# Patient Record
Sex: Male | Born: 1960 | Hispanic: Yes | Marital: Married | Smoking: Never smoker
Health system: Southern US, Community
[De-identification: ages and names within clinical notes are randomized; demographics above are authoritative.]

## PROBLEM LIST (undated history)

## (undated) DIAGNOSIS — I219 Acute myocardial infarction, unspecified: Secondary | ICD-10-CM

## (undated) DIAGNOSIS — I255 Ischemic cardiomyopathy: Secondary | ICD-10-CM

## (undated) DIAGNOSIS — I2510 Atherosclerotic heart disease of native coronary artery without angina pectoris: Secondary | ICD-10-CM

## (undated) DIAGNOSIS — E119 Type 2 diabetes mellitus without complications: Secondary | ICD-10-CM

## (undated) DIAGNOSIS — R42 Dizziness and giddiness: Secondary | ICD-10-CM

## (undated) DIAGNOSIS — I209 Angina pectoris, unspecified: Secondary | ICD-10-CM

## (undated) DIAGNOSIS — I10 Essential (primary) hypertension: Secondary | ICD-10-CM

## (undated) DIAGNOSIS — E785 Hyperlipidemia, unspecified: Secondary | ICD-10-CM

## (undated) HISTORY — PX: CORONARY ANGIOPLASTY: SHX604

## (undated) HISTORY — DX: Ischemic cardiomyopathy: I25.5

## (undated) HISTORY — PX: COLONOSCOPY: SHX174

## (undated) HISTORY — DX: Essential (primary) hypertension: I10

## (undated) HISTORY — PX: CORONARY ANGIOPLASTY WITH STENT PLACEMENT: SHX49

## (undated) HISTORY — DX: Atherosclerotic heart disease of native coronary artery without angina pectoris: I25.10

## (undated) HISTORY — PX: OTHER SURGICAL HISTORY: SHX169

---

## 2008-11-28 ENCOUNTER — Ambulatory Visit: Payer: Self-pay | Admitting: Gastroenterology

## 2011-01-13 ENCOUNTER — Ambulatory Visit: Payer: Self-pay | Admitting: Internal Medicine

## 2015-03-05 ENCOUNTER — Emergency Department: Payer: Commercial Managed Care - HMO

## 2015-03-05 ENCOUNTER — Encounter: Payer: Self-pay | Admitting: Emergency Medicine

## 2015-03-05 ENCOUNTER — Inpatient Hospital Stay
Admission: EM | Admit: 2015-03-05 | Discharge: 2015-03-08 | DRG: 247 | Disposition: A | Discharge status: Home / Self Care | Payer: Commercial Managed Care - HMO | Attending: Internal Medicine | Admitting: Internal Medicine

## 2015-03-05 DIAGNOSIS — R001 Bradycardia, unspecified: Secondary | ICD-10-CM | POA: Diagnosis present

## 2015-03-05 DIAGNOSIS — Z833 Family history of diabetes mellitus: Secondary | ICD-10-CM | POA: Diagnosis not present

## 2015-03-05 DIAGNOSIS — E1159 Type 2 diabetes mellitus with other circulatory complications: Secondary | ICD-10-CM | POA: Diagnosis present

## 2015-03-05 DIAGNOSIS — Z79899 Other long term (current) drug therapy: Secondary | ICD-10-CM | POA: Diagnosis not present

## 2015-03-05 DIAGNOSIS — I255 Ischemic cardiomyopathy: Secondary | ICD-10-CM | POA: Diagnosis present

## 2015-03-05 DIAGNOSIS — Z9111 Patient's noncompliance with dietary regimen: Secondary | ICD-10-CM | POA: Diagnosis present

## 2015-03-05 DIAGNOSIS — I10 Essential (primary) hypertension: Secondary | ICD-10-CM | POA: Diagnosis present

## 2015-03-05 DIAGNOSIS — I214 Non-ST elevation (NSTEMI) myocardial infarction: Secondary | ICD-10-CM | POA: Diagnosis present

## 2015-03-05 DIAGNOSIS — I2510 Atherosclerotic heart disease of native coronary artery without angina pectoris: Secondary | ICD-10-CM | POA: Diagnosis not present

## 2015-03-05 DIAGNOSIS — Z8249 Family history of ischemic heart disease and other diseases of the circulatory system: Secondary | ICD-10-CM

## 2015-03-05 DIAGNOSIS — Z87891 Personal history of nicotine dependence: Secondary | ICD-10-CM | POA: Diagnosis not present

## 2015-03-05 DIAGNOSIS — I25119 Atherosclerotic heart disease of native coronary artery with unspecified angina pectoris: Secondary | ICD-10-CM | POA: Diagnosis present

## 2015-03-05 DIAGNOSIS — I209 Angina pectoris, unspecified: Secondary | ICD-10-CM | POA: Diagnosis not present

## 2015-03-05 DIAGNOSIS — E1151 Type 2 diabetes mellitus with diabetic peripheral angiopathy without gangrene: Secondary | ICD-10-CM | POA: Diagnosis not present

## 2015-03-05 DIAGNOSIS — R079 Chest pain, unspecified: Secondary | ICD-10-CM

## 2015-03-05 DIAGNOSIS — E1165 Type 2 diabetes mellitus with hyperglycemia: Secondary | ICD-10-CM | POA: Diagnosis present

## 2015-03-05 DIAGNOSIS — E785 Hyperlipidemia, unspecified: Secondary | ICD-10-CM | POA: Diagnosis present

## 2015-03-05 HISTORY — DX: Hyperlipidemia, unspecified: E78.5

## 2015-03-05 HISTORY — DX: Type 2 diabetes mellitus without complications: E11.9

## 2015-03-05 LAB — CBC WITH DIFFERENTIAL/PLATELET
BASOS ABS: 0 10*3/uL (ref 0–0.1)
Basophils Relative: 0 %
EOS PCT: 3 %
Eosinophils Absolute: 0.3 10*3/uL (ref 0–0.7)
HEMATOCRIT: 43.4 % (ref 40.0–52.0)
HEMOGLOBIN: 14.6 g/dL (ref 13.0–18.0)
LYMPHS PCT: 52 %
Lymphs Abs: 4.5 10*3/uL — ABNORMAL HIGH (ref 1.0–3.6)
MCH: 29.2 pg (ref 26.0–34.0)
MCHC: 33.7 g/dL (ref 32.0–36.0)
MCV: 86.7 fL (ref 80.0–100.0)
MONOS PCT: 12 %
Monocytes Absolute: 1.1 10*3/uL — ABNORMAL HIGH (ref 0.2–1.0)
NEUTROS PCT: 33 %
Neutro Abs: 2.9 10*3/uL (ref 1.4–6.5)
Platelets: 200 10*3/uL (ref 150–440)
RBC: 5 MIL/uL (ref 4.40–5.90)
RDW: 14.3 % (ref 11.5–14.5)
WBC: 8.8 10*3/uL (ref 3.8–10.6)

## 2015-03-05 LAB — COMPREHENSIVE METABOLIC PANEL
ALBUMIN: 4.3 g/dL (ref 3.5–5.0)
ALK PHOS: 68 U/L (ref 38–126)
ALT: 26 U/L (ref 17–63)
AST: 28 U/L (ref 15–41)
Anion gap: 8 (ref 5–15)
BILIRUBIN TOTAL: 0.4 mg/dL (ref 0.3–1.2)
BUN: 19 mg/dL (ref 6–20)
CALCIUM: 9 mg/dL (ref 8.9–10.3)
CO2: 27 mmol/L (ref 22–32)
Chloride: 104 mmol/L (ref 101–111)
Creatinine, Ser: 0.93 mg/dL (ref 0.61–1.24)
GFR calc Af Amer: >60 mL/min (ref >60)
GFR calc non Af Amer: >60 mL/min (ref >60)
GLUCOSE: 141 mg/dL — AB (ref 65–99)
Potassium: 3.9 mmol/L (ref 3.5–5.1)
SODIUM: 139 mmol/L (ref 135–145)
TOTAL PROTEIN: 7.4 g/dL (ref 6.5–8.1)

## 2015-03-05 LAB — GLUCOSE, CAPILLARY: Glucose-Capillary: 245 mg/dL — ABNORMAL HIGH (ref 65–99)

## 2015-03-05 LAB — PROTIME-INR
INR: 0.96
PROTHROMBIN TIME: 13 s (ref 11.4–15.0)

## 2015-03-05 LAB — TROPONIN I: Troponin I: 0.05 ng/mL — ABNORMAL HIGH (ref <0.031)

## 2015-03-05 LAB — APTT: aPTT: 28 seconds (ref 24–36)

## 2015-03-05 LAB — LIPASE, BLOOD: Lipase: 27 U/L (ref 22–51)

## 2015-03-05 MED ORDER — ONDANSETRON HCL 4 MG/2ML IJ SOLN: 4.0000 mg | Freq: Four times a day (QID) | INTRAMUSCULAR | Status: DC | PRN

## 2015-03-05 MED ORDER — HEPARIN (PORCINE) IN NACL 100-0.45 UNIT/ML-% IJ SOLN
900.0000 [IU]/h | INTRAMUSCULAR | Status: DC
  Administered 2015-03-05: 900 [IU]/h via INTRAVENOUS
  Filled 2015-03-05 (×2): qty 250

## 2015-03-05 MED ORDER — INSULIN ASPART 100 UNIT/ML ~~LOC~~ SOLN
0.0000 [IU] | Freq: Every day | SUBCUTANEOUS | Status: DC
  Administered 2015-03-06: 2 [IU] via SUBCUTANEOUS
  Filled 2015-03-05: qty 2

## 2015-03-05 MED ORDER — HEPARIN SODIUM (PORCINE) 5000 UNIT/ML IJ SOLN
INTRAMUSCULAR | Status: AC
  Filled 2015-03-05: qty 1

## 2015-03-05 MED ORDER — MORPHINE SULFATE (PF) 2 MG/ML IV SOLN: 2.0000 mg | INTRAVENOUS | Status: DC | PRN

## 2015-03-05 MED ORDER — ONDANSETRON HCL 4 MG PO TABS: 4.0000 mg | ORAL_TABLET | Freq: Four times a day (QID) | ORAL | Status: DC | PRN

## 2015-03-05 MED ORDER — HEPARIN BOLUS VIA INFUSION
4000.0000 [IU] | Freq: Once | INTRAVENOUS | Status: AC
  Administered 2015-03-05: 4000 [IU] via INTRAVENOUS
  Filled 2015-03-05: qty 4000

## 2015-03-05 MED ORDER — ACETAMINOPHEN 650 MG RE SUPP: 650.0000 mg | Freq: Four times a day (QID) | RECTAL | Status: DC | PRN

## 2015-03-05 MED ORDER — NITROGLYCERIN 0.4 MG SL SUBL: 0.4000 mg | SUBLINGUAL_TABLET | SUBLINGUAL | Status: DC | PRN

## 2015-03-05 MED ORDER — SODIUM CHLORIDE 0.9 % IJ SOLN
3.0000 mL | Freq: Two times a day (BID) | INTRAMUSCULAR | Status: DC
  Administered 2015-03-05 – 2015-03-06 (×2): 3 mL via INTRAVENOUS

## 2015-03-05 MED ORDER — OXYCODONE HCL 5 MG PO TABS: 5.0000 mg | ORAL_TABLET | ORAL | Status: DC | PRN

## 2015-03-05 MED ORDER — ATORVASTATIN CALCIUM 20 MG PO TABS: 20.0000 mg | ORAL_TABLET | Freq: Every day | ORAL | Status: DC

## 2015-03-05 MED ORDER — ASPIRIN EC 325 MG PO TBEC: 325.0000 mg | DELAYED_RELEASE_TABLET | Freq: Every day | ORAL | Status: DC

## 2015-03-05 MED ORDER — LISINOPRIL 5 MG PO TABS: 5.0000 mg | ORAL_TABLET | Freq: Every day | ORAL | Status: DC

## 2015-03-05 MED ORDER — INSULIN ASPART 100 UNIT/ML ~~LOC~~ SOLN
0.0000 [IU] | Freq: Three times a day (TID) | SUBCUTANEOUS | Status: DC
  Administered 2015-03-07: 3 [IU] via SUBCUTANEOUS
  Administered 2015-03-08: 2 [IU] via SUBCUTANEOUS
  Filled 2015-03-05: qty 2
  Filled 2015-03-05: qty 3

## 2015-03-05 MED ORDER — ACETAMINOPHEN 325 MG PO TABS: 650.0000 mg | ORAL_TABLET | Freq: Four times a day (QID) | ORAL | Status: DC | PRN

## 2015-03-05 MED ORDER — NITROGLYCERIN 2 % TD OINT
1.0000 [in_us] | TOPICAL_OINTMENT | Freq: Four times a day (QID) | TRANSDERMAL | Status: DC
  Administered 2015-03-05 – 2015-03-06 (×3): 1 [in_us] via TOPICAL
  Filled 2015-03-05 (×3): qty 1

## 2015-03-05 MED ORDER — INFLUENZA VAC SPLIT QUAD 0.5 ML IM SUSY
0.5000 mL | PREFILLED_SYRINGE | INTRAMUSCULAR | Status: AC
  Administered 2015-03-08: 0.5 mL via INTRAMUSCULAR
  Filled 2015-03-05: qty 0.5

## 2015-03-05 NOTE — H&P (Signed)
[facility] - [place] at [facility]   PATIENT NAME: Jeff Howard    MR#:  [id number]  DATE OF BIRTH:  [birth year]   DATE OF ADMISSION:  03/05/2015  PRIMARY CARE PHYSICIAN: [name], MD   REQUESTING/REFERRING PHYSICIAN: Shaune Pollack  CHIEF COMPLAINT:   Chief Complaint  Patient presents with  . Chest Pain    HISTORY OF PRESENT ILLNESS:  Jeff Howard  is a 54 y.o. male with a known history of type 2 diabetes non-insulin-requiring, essential hypertension presenting with chest pain. He describes acute onset chest pain which occurred approximately 8 PM at rest, retrosternal in location, pressure in quality, 8/10 intensity and no worsening or relieving factors nonradiating had some associated shortness of breath. Currently chest pain-free emergency department course noted mildly elevated troponin 0.05 subsequent started on therapeutic heparin by ED staff  PAST MEDICAL HISTORY:   Past Medical History  Diagnosis Date  . Diabetes mellitus without complication   . Hypertension   . Hyperlipemia     PAST SURGICAL HISTORY:  History reviewed. No pertinent past surgical history.  SOCIAL HISTORY:   Social History  Substance Use Topics  . Smoking status: Never Smoker   . Smokeless tobacco: Not on file  . Alcohol Use: No    FAMILY HISTORY:   Family History  Problem Relation Age of Onset  . Diabetes Other     DRUG ALLERGIES:  No Known Allergies  REVIEW OF SYSTEMS:  REVIEW OF SYSTEMS:  CONSTITUTIONAL: Denies fevers, chills, fatigue, weakness.  EYES: Denies blurred vision, double vision, or eye pain.  EARS, NOSE, THROAT: Denies tinnitus, ear pain, hearing loss.  RESPIRATORY: denies cough, positive shortness of breath, denies wheezing  CARDIOVASCULAR: Positive chest pain, denies palpitations, edema.  GASTROINTESTINAL: Denies nausea, vomiting, diarrhea, abdominal pain.  GENITOURINARY: Denies dysuria, hematuria.  ENDOCRINE: Denies  nocturia or thyroid problems. HEMATOLOGIC AND LYMPHATIC: Denies easy bruising or bleeding.  SKIN: Denies rash or lesions.  MUSCULOSKELETAL: Denies pain in neck, back, shoulder, knees, hips, or further arthritic symptoms.  NEUROLOGIC: Denies paralysis, paresthesias.  PSYCHIATRIC: Denies anxiety or depressive symptoms. Otherwise full review of systems performed by me is negative.   MEDICATIONS AT HOME:   Prior to Admission medications   Medication Sig Start Date End Date Taking? Authorizing Provider  atorvastatin (LIPITOR) 20 MG tablet Take 20 mg by mouth daily at 6 PM.   Yes Historical Provider, MD  lisinopril (PRINIVIL,ZESTRIL) 5 MG tablet Take 5 mg by mouth daily.   Yes Historical Provider, MD  metFORMIN (GLUCOPHAGE-XR) 500 MG 24 hr tablet Take 2,000 mg by mouth daily after supper.   Yes Historical Provider, MD  pioglitazone (ACTOS) 45 MG tablet Take 45 mg by mouth daily.   Yes Historical Provider, MD  saxagliptin HCl (ONGLYZA) 5 MG TABS tablet Take 5 mg by mouth daily.   Yes Historical Provider, MD      VITAL SIGNS:  Blood pressure 169/97, pulse 65, resp. rate 22, height  (1.676 m), weight 167 lb (75.751 kg), SpO2 98 %.  PHYSICAL EXAMINATION:  VITAL SIGNS: Filed Vitals:   03/05/15 2158  BP: 169/97  Pulse: 65  Resp: 22   GENERAL:54 y.o.male currently in no acute distress.  HEAD: Normocephalic, atraumatic.  EYES: Pupils equal, round, reactive to light. Extraocular muscles intact. No scleral icterus.  MOUTH: Moist mucosal membrane. Dentition intact. No abscess noted.  EAR, NOSE, THROAT: Clear without exudates. No external lesions.  NECK: Supple. No thyromegaly. No nodules. No JVD.  PULMONARY:  Clear to ascultation, without wheeze rails or rhonci. No use of accessory muscles, Good respiratory effort. good air entry bilaterally CHEST: Nontender to palpation.  CARDIOVASCULAR: S1 and S2. Regular rate and rhythm. No murmurs, rubs, or gallops. No edema. Pedal pulses 2+  bilaterally.  GASTROINTESTINAL: Soft, nontender, nondistended. No masses. Positive bowel sounds. No hepatosplenomegaly.  MUSCULOSKELETAL: No swelling, clubbing, or edema. Range of motion full in all extremities.  NEUROLOGIC: Cranial nerves II through XII are intact. No gross focal neurological deficits. Sensation intact. Reflexes intact.  SKIN: No ulceration, lesions, rashes, or cyanosis. Skin warm and dry. Turgor intact.  PSYCHIATRIC: Mood, affect within normal limits. The patient is awake, alert and oriented x 3. Insight, judgment intact.    LABORATORY PANEL:   CBC  Recent Labs Lab 03/05/15 2026  WBC 8.8  HGB 14.6  HCT 43.4  PLT 200   ------------------------------------------------------------------------------------------------------------------  Chemistries   Recent Labs Lab 03/05/15 2026  NA 139  K 3.9  CL 104  CO2 27  GLUCOSE 141*  BUN 19  CREATININE 0.93  CALCIUM 9.0  AST 28  ALT 26  ALKPHOS 68  BILITOT 0.4   ------------------------------------------------------------------------------------------------------------------  Cardiac Enzymes  Recent Labs Lab 03/05/15 2026  TROPONINI 0.05*   ------------------------------------------------------------------------------------------------------------------  RADIOLOGY:  Dg Chest Port 1 View  03/05/2015   CLINICAL DATA:  Chest pain beginning today. Diaphoresis and shortness of breath.  EXAM: PORTABLE CHEST 1 VIEW  COMPARISON:  None.  FINDINGS: The lungs are clear. Heart size normal. No pneumothorax or pleural effusion. No focal bony abnormality.  IMPRESSION: Negative chest.   Electronically Signed   By: Drusilla Kanner M.D.   On: 03/05/2015 20:54    EKG:   Orders placed or performed during the hospital encounter of 03/05/15  . EKG 12-Lead  . EKG 12-Lead  . ED EKG within 10 minutes  . ED EKG within 10 minutes    IMPRESSION AND PLAN:   54 year old Hispanic gentleman history of type 2 diabetes  non-insulin-requiring percent with chest pain.  1. NSTEMI: Initiate aspirin/statin therapy, place on telemetry, trend cardiac enzymes 3, therapeutic heparin are initiated, consult cardiology, check echocardiogram and lipid panel 2. Type 2 diabetes non-insulin-requiring: Hold oral agents at insulin sliding scale with Accu-Cheks 3. Essential hypertension: Lisinopril 4. Hyperlipidemia unspecified Lipitor 5. Venous thrombosis) prophylactic: Therapeutic heparin    All the records are reviewed and case discussed with ED provider. Management plans discussed with the patient, family and they are in agreement.  CODE STATUS: Full  TOTAL TIME TAKING CARE OF THIS PATIENT: 35 minutes.    Hower,  Mardi Mainland.D on 03/05/2015 at 10:28 PM  Between 7am to 6pm - Pager - [phone]  After 6pm: House Pager: - [phone]  [facility]  Office  [phone]  CC: Primary care physician; Shapely-Quinn, Desiree Lucy, MD

## 2015-03-05 NOTE — ED Notes (Signed)
Lab called for add on 

## 2015-03-05 NOTE — ED Notes (Signed)
reonset chest pain, EKG repeated, Dr Shaune Pollack notified

## 2015-03-05 NOTE — ED Notes (Signed)
Patient presents to Emergency Department via EMS with complaints of central chest pain.  EMS gave 324 ASA, and 3 x SL nitro, pt 10/10 pain at the time sweating and SOB.

## 2015-03-05 NOTE — Progress Notes (Signed)
ANTICOAGULATION CONSULT NOTE - Initial Consult  Pharmacy Consult for Heparin Indication: chest pain/ACS  No Known Allergies  Patient Measurements: Height:  (167.6 cm) Weight: 167 lb (75.751 kg) IBW/kg (Calculated) : 63.8 Heparin Dosing Weight: 75.8 kg  Vital Signs: BP: 166/102 mmHg (09/25 2025) Pulse Rate: 56 (09/25 2025)  Labs:  Recent Labs  03/05/15 2026  HGB 14.6  HCT 43.4  PLT 200  CREATININE 0.93  TROPONINI 0.05*    Estimated Creatinine Clearance: 80 mL/min (by C-G formula based on Cr of 0.93).   Medical History: Past Medical History  Diagnosis Date  . Diabetes mellitus without complication   . Hypertension   . Hyperlipemia     Medications:   (Not in a hospital admission)  Assessment: Pharmacy consulted to dose heparin in this 54 year old male admitted with ACS/STEMI. CrCl = ?  Goal of Therapy:  Heparin level 0.3-0.7 units/ml Monitor platelets by anticoagulation protocol: Yes   Plan:  Give 4000 units bolus x 1 Start heparin infusion at 900 units/hr  Will order baseline aPTT and INR. Will draw 1st HL 6 hrs after start of drip on 9/26 @   Robbins,Jason D 03/05/2015,9:30 PM

## 2015-03-05 NOTE — ED Notes (Signed)
Dr Shaune Pollack notified of TROPONIN 0.05

## 2015-03-05 NOTE — ED Provider Notes (Signed)
[facility] Emergency Department Provider Note   ____________________________________________  Time seen: 8:22 PM I have reviewed the triage vital signs and the triage nursing note.  HISTORY  Chief Complaint Chest Pain   Historian Patient  HPI Jeff Howard is a 54 y.o. male who had acute onset of central chest pain 10 out of 10 just prior to arrival. Patient called EMS. Patient was diaphoretic with mild soreness of breath, but no palpitations, no extension of the pain up into the jaw or to the shoulders or the back. He did have an episode like this yesterday in the morning that lasted for about 5 minutes and went away with rest. He does have a history of smoking, or prior coronary artery disease. He is treated for high blood pressure and diabetes reportedly. Patient received aspirin and sublingual nitroglycerin prior to arrival and chest pain now as 5 out of 10.    Past Medical History  Diagnosis Date  . Diabetes mellitus without complication   . Hypertension   . Hyperlipemia    diabetes and hypertension  There are no active problems to display for this patient.   History reviewed. No pertinent past surgical history.  No current outpatient prescriptions on file.  Allergies Review of patient's allergies indicates no known allergies.  History reviewed. No pertinent family history.  Social History Social History  Substance Use Topics  . Smoking status: Never Smoker   . Smokeless tobacco: None  . Alcohol Use: No    Review of Systems  Constitutional: Negative for fever. Eyes: Negative for visual changes. ENT: Negative for sore throat. Cardiovascular: Negative for palpitations. Respiratory: Negative for cough. Gastrointestinal: Negative for abdominal pain, vomiting and diarrhea. Genitourinary: Negative for dysuria. Musculoskeletal: Negative for back pain. Skin: Negative for rash. Neurological: Negative for headache. 10 point Review of  Systems otherwise negative ____________________________________________   PHYSICAL EXAM:  VITAL SIGNS: ED Triage Vitals  Enc Vitals Group     BP --      Pulse --      Resp --      Temp --      Temp src --      SpO2 --      Weight --      Height --      Head Cir --      Peak Flow --      Pain Score --      Pain Loc --      Pain Edu? --      Excl. in GC? --      Constitutional: Alert and oriented. Well appearing and in no distress. Slightly diaphoretic. Eyes: Conjunctivae are normal. PERRL. Normal extraocular movements. ENT   Head: Normocephalic and atraumatic.   Nose: No congestion/rhinnorhea.   Mouth/Throat: Mucous membranes are moist.   Neck: No stridor. Cardiovascular/Chest: Normal rate, regular rhythm.  No murmurs, rubs, or gallops. Respiratory: Normal respiratory effort without tachypnea nor retractions. Breath sounds are clear and equal bilaterally. No wheezes/rales/rhonchi. Gastrointestinal: Soft. No distention, no guarding, no rebound. Nontender   Genitourinary/rectal:Deferred Musculoskeletal: Nontender with normal range of motion in all extremities. No joint effusions.  No lower extremity tenderness.  No edema. Neurologic:  Normal speech and language. No gross or focal neurologic deficits are appreciated. Skin:  Skin is warm, dry and intact. No rash noted. Psychiatric: Mood and affect are normal. Speech and behavior are normal. Patient exhibits appropriate insight and judgment.  ____________________________________________   EKG I, Governor Rooks, MD, the attending  physician have personally viewed and interpreted all ECGs.  20:27 sinus bradycardia 57 bpm. Narrow QRS. Normal axis. Minimal ST depression in lead 3 which is isolated. Otherwise no ST segment elevation or ST segment depression or T-wave inversion.  21:18 sinus bradycardia 58 bpm. Narrow QRS. Normal axis. No ST segment elevation, ST segment depression, or T-wave  inversion. ____________________________________________  LABS (pertinent positives/negatives)  Troponin 0.05 White blood count 8.8, hemoglobin 14.6 and platelet count 200 Comprehensive metabolic panel without significant abnormality Lipase 27  ____________________________________________  RADIOLOGY All Xrays were viewed by me. Imaging interpreted by Radiologist.  Chest x-ray portable one view: Negative chest __________________________________________  PROCEDURES  Procedure(s) performed: None  Critical Care performed: CRITICAL CARE Performed by: Governor Rooks   Total critical care time: 30 minutes  Critical care time was exclusive of separately billable procedures and treating other patients.  Critical care was necessary to treat or prevent imminent or life-threatening deterioration.  Critical care was time spent personally by me on the following activities: development of treatment plan with patient and/or surrogate as well as nursing, discussions with consultants, evaluation of patient's response to treatment, examination of patient, obtaining history from patient or surrogate, ordering and performing treatments and interventions, ordering and review of laboratory studies, ordering and review of radiographic studies, pulse oximetry and re-evaluation of patient's condition.   ____________________________________________   ED COURSE / ASSESSMENT AND PLAN  CONSULTATIONS: Face-to-face consultation with hospitalist for admission. Further consultation with cardiologist, Dr. Gwen Pounds  Pertinent labs & imaging results that were available during my care of the patient were reviewed by me and considered in my medical decision making (see chart for details).   Patient arrived with a complaint of initially 10 out of 10 chest pain with EMS, but on arrival after aspirin and supplemental nitroglycerin patient was now 5 out of 10. His EKGs prehospital and obtained here in the ED show  no concerning findings for acute cord syndrome, however his symptoms do sound potentially cardiac in etiology given the central chest pain with diaphoresis.  I was notified by the nurse and the patient began having chest pain again, and repeat EKG was performed. No new changes without EKG. At about the same time troponin was called minimally elevated 0.05. Given the patient's clinical presentation, I will go ahead and treat him with heparin bolus and drip for acute coronary syndrome and admit him to the hospital for further evaluation and management.  I was notified by the nurse and the patient had a change in his heart rhythm that was captured on the monitor. I reviewed this monitor strip initially showed a wide-complex what appeared to be left bundle branch block that extended for several screened on the monitor, and then 20 a converted back to a narrow complex normal sinus rhythm. I discussed this case including the rhythm change with Dr. Gwen Pounds, who did not return from and any additional emergency department treatment, but continue on aspirin, heparin, nitrates, and hospital admission.  Patient / Family / Caregiver informed of clinical course, medical decision-making process, and agree with plan.   ___________________________________________   FINAL CLINICAL IMPRESSION(S) / ED DIAGNOSES   Final diagnoses:  Chest pain, unspecified chest pain type       Governor Rooks, MD 03/05/15 2228

## 2015-03-06 ENCOUNTER — Encounter
Admission: EM | Disposition: A | Discharge status: Home / Self Care | Payer: Self-pay | Source: Home / Self Care | Attending: Internal Medicine

## 2015-03-06 ENCOUNTER — Inpatient Hospital Stay (HOSPITAL_COMMUNITY)
Admit: 2015-03-06 | Discharge: 2015-03-06 | Disposition: A | Discharge status: Home / Self Care | Payer: Commercial Managed Care - HMO | Attending: Internal Medicine | Admitting: Internal Medicine

## 2015-03-06 DIAGNOSIS — I214 Non-ST elevation (NSTEMI) myocardial infarction: Principal | ICD-10-CM

## 2015-03-06 DIAGNOSIS — I2510 Atherosclerotic heart disease of native coronary artery without angina pectoris: Secondary | ICD-10-CM

## 2015-03-06 DIAGNOSIS — R079 Chest pain, unspecified: Secondary | ICD-10-CM

## 2015-03-06 HISTORY — PX: CARDIAC CATHETERIZATION: SHX172

## 2015-03-06 LAB — CBC
HEMATOCRIT: 41.5 % (ref 40.0–52.0)
Hemoglobin: 14 g/dL (ref 13.0–18.0)
MCH: 28.9 pg (ref 26.0–34.0)
MCHC: 33.8 g/dL (ref 32.0–36.0)
MCV: 85.5 fL (ref 80.0–100.0)
PLATELETS: 182 10*3/uL (ref 150–440)
RBC: 4.85 MIL/uL (ref 4.40–5.90)
RDW: 14.4 % (ref 11.5–14.5)
WBC: 9.4 10*3/uL (ref 3.8–10.6)

## 2015-03-06 LAB — BASIC METABOLIC PANEL
Anion gap: 6 (ref 5–15)
BUN: 18 mg/dL (ref 6–20)
CHLORIDE: 107 mmol/L (ref 101–111)
CO2: 26 mmol/L (ref 22–32)
CREATININE: 0.76 mg/dL (ref 0.61–1.24)
Calcium: 8.5 mg/dL — ABNORMAL LOW (ref 8.9–10.3)
GFR calc Af Amer: >60 mL/min (ref >60)
GFR calc non Af Amer: >60 mL/min (ref >60)
Glucose, Bld: 134 mg/dL — ABNORMAL HIGH (ref 65–99)
POTASSIUM: 4 mmol/L (ref 3.5–5.1)
Sodium: 139 mmol/L (ref 135–145)

## 2015-03-06 LAB — LIPID PANEL
CHOL/HDL RATIO: 2.7 ratio
CHOLESTEROL: 140 mg/dL (ref 0–200)
HDL: 52 mg/dL (ref >40)
LDL CALC: 76 mg/dL (ref 0–99)
TRIGLYCERIDES: 60 mg/dL (ref <150)
VLDL: 12 mg/dL (ref 0–40)

## 2015-03-06 LAB — TROPONIN I
Troponin I: 7.94 ng/mL — ABNORMAL HIGH (ref <0.031)
Troponin I: 28.04 ng/mL — ABNORMAL HIGH (ref <0.031)

## 2015-03-06 LAB — GLUCOSE, CAPILLARY
Glucose-Capillary: 129 mg/dL — ABNORMAL HIGH (ref 65–99)
Glucose-Capillary: 165 mg/dL — ABNORMAL HIGH (ref 65–99)
Glucose-Capillary: 153 mg/dL — ABNORMAL HIGH (ref 65–99)

## 2015-03-06 LAB — HEPARIN LEVEL (UNFRACTIONATED): HEPARIN UNFRACTIONATED: 0.31 [IU]/mL (ref 0.30–0.70)

## 2015-03-06 SURGERY — LEFT HEART CATH AND CORONARY ANGIOGRAPHY
Anesthesia: Moderate Sedation

## 2015-03-06 MED ORDER — SODIUM CHLORIDE 0.9 % IJ SOLN: 3.0000 mL | INTRAMUSCULAR | Status: DC | PRN

## 2015-03-06 MED ORDER — ASPIRIN 81 MG PO CHEW
81.0000 mg | CHEWABLE_TABLET | Freq: Every day | ORAL | Status: DC
  Administered 2015-03-07 – 2015-03-08 (×2): 81 mg via ORAL
  Filled 2015-03-06 (×2): qty 1

## 2015-03-06 MED ORDER — SODIUM CHLORIDE 0.9 % IV SOLN
INTRAVENOUS | Status: AC
  Administered 2015-03-06: 13:00:00 via INTRAVENOUS

## 2015-03-06 MED ORDER — SODIUM CHLORIDE 0.9 % IV SOLN: 250.0000 mL | INTRAVENOUS | Status: DC | PRN

## 2015-03-06 MED ORDER — VERAPAMIL HCL 2.5 MG/ML IV SOLN
INTRAVENOUS | Status: DC | PRN
  Administered 2015-03-06: 2.5 mg via INTRA_ARTERIAL

## 2015-03-06 MED ORDER — FENTANYL CITRATE (PF) 100 MCG/2ML IJ SOLN
INTRAMUSCULAR | Status: DC | PRN
  Administered 2015-03-06: 50 ug via INTRAVENOUS
  Administered 2015-03-06: 25 ug via INTRAVENOUS

## 2015-03-06 MED ORDER — HEPARIN SODIUM (PORCINE) 1000 UNIT/ML IJ SOLN
INTRAMUSCULAR | Status: DC | PRN
  Administered 2015-03-06: 4000 [IU] via INTRAVENOUS

## 2015-03-06 MED ORDER — VERAPAMIL HCL 2.5 MG/ML IV SOLN
INTRAVENOUS | Status: AC
  Filled 2015-03-06: qty 2

## 2015-03-06 MED ORDER — ASPIRIN 81 MG PO CHEW
CHEWABLE_TABLET | ORAL | Status: AC
  Filled 2015-03-06: qty 4

## 2015-03-06 MED ORDER — HEPARIN (PORCINE) IN NACL 2-0.9 UNIT/ML-% IJ SOLN
INTRAMUSCULAR | Status: AC
  Filled 2015-03-06: qty 1000

## 2015-03-06 MED ORDER — PRASUGREL HCL 10 MG PO TABS
ORAL_TABLET | ORAL | Status: AC
  Filled 2015-03-06: qty 6

## 2015-03-06 MED ORDER — ATORVASTATIN CALCIUM 20 MG PO TABS
80.0000 mg | ORAL_TABLET | Freq: Every day | ORAL | Status: DC
  Administered 2015-03-06 – 2015-03-07 (×2): 80 mg via ORAL
  Filled 2015-03-06 (×3): qty 4
  Filled 2015-03-06: qty 1

## 2015-03-06 MED ORDER — ASPIRIN 81 MG PO CHEW
CHEWABLE_TABLET | ORAL | Status: DC | PRN
  Administered 2015-03-06: 324 mg via ORAL

## 2015-03-06 MED ORDER — NITROGLYCERIN 1 MG/10 ML FOR IR/CATH LAB
INTRA_ARTERIAL | Status: DC | PRN
  Administered 2015-03-06 (×2): 200 ug via INTRACORONARY

## 2015-03-06 MED ORDER — SODIUM CHLORIDE 0.9 % IJ SOLN: 3.0000 mL | Freq: Two times a day (BID) | INTRAMUSCULAR | Status: DC

## 2015-03-06 MED ORDER — PRASUGREL HCL 10 MG PO TABS
10.0000 mg | ORAL_TABLET | Freq: Every day | ORAL | Status: DC
  Administered 2015-03-07 – 2015-03-08 (×2): 10 mg via ORAL
  Filled 2015-03-06 (×2): qty 1

## 2015-03-06 MED ORDER — SODIUM CHLORIDE 0.9 % IV SOLN: INTRAVENOUS | Status: DC

## 2015-03-06 MED ORDER — SODIUM CHLORIDE 0.9 % IJ SOLN
3.0000 mL | Freq: Two times a day (BID) | INTRAMUSCULAR | Status: DC
  Administered 2015-03-06 – 2015-03-08 (×4): 3 mL via INTRAVENOUS

## 2015-03-06 MED ORDER — HEPARIN SODIUM (PORCINE) 1000 UNIT/ML IJ SOLN
INTRAMUSCULAR | Status: AC
  Filled 2015-03-06: qty 1

## 2015-03-06 MED ORDER — NITROGLYCERIN 5 MG/ML IV SOLN
INTRAVENOUS | Status: AC
  Filled 2015-03-06: qty 10

## 2015-03-06 MED ORDER — BIVALIRUDIN BOLUS VIA INFUSION - CUPID
INTRAVENOUS | Status: DC | PRN
  Administered 2015-03-06: 58.2 mg via INTRAVENOUS

## 2015-03-06 MED ORDER — ASPIRIN 81 MG PO CHEW: 81.0000 mg | CHEWABLE_TABLET | ORAL | Status: DC

## 2015-03-06 MED ORDER — PRASUGREL HCL 10 MG PO TABS
ORAL_TABLET | ORAL | Status: DC | PRN
  Administered 2015-03-06: 60 mg via ORAL

## 2015-03-06 MED ORDER — MIDAZOLAM HCL 2 MG/2ML IJ SOLN
INTRAMUSCULAR | Status: AC
  Filled 2015-03-06: qty 2

## 2015-03-06 MED ORDER — BIVALIRUDIN 250 MG IV SOLR
INTRAVENOUS | Status: AC
  Filled 2015-03-06: qty 250

## 2015-03-06 MED ORDER — IOHEXOL 300 MG/ML  SOLN
INTRAMUSCULAR | Status: DC | PRN
  Administered 2015-03-06: 210 mL

## 2015-03-06 MED ORDER — BIVALIRUDIN 250 MG IV SOLR: 250.0000 mg | INTRAVENOUS | Status: DC | PRN

## 2015-03-06 MED ORDER — MIDAZOLAM HCL 2 MG/2ML IJ SOLN
INTRAMUSCULAR | Status: DC | PRN
  Administered 2015-03-06: 1 mg via INTRAVENOUS

## 2015-03-06 MED ORDER — LISINOPRIL 10 MG PO TABS
10.0000 mg | ORAL_TABLET | Freq: Every day | ORAL | Status: DC
  Administered 2015-03-06 – 2015-03-08 (×3): 10 mg via ORAL
  Filled 2015-03-06 (×3): qty 1

## 2015-03-06 MED ORDER — FENTANYL CITRATE (PF) 100 MCG/2ML IJ SOLN
INTRAMUSCULAR | Status: AC
  Filled 2015-03-06: qty 2

## 2015-03-06 SURGICAL SUPPLY — 20 items
BALLN TREK RX 2.5X12 (BALLOONS) ×2
BALLN ~~LOC~~ TREK RX 3.0X15 (BALLOONS) ×2
BALLOON TREK RX 2.5X12 (BALLOONS) ×1 IMPLANT
BALLOON ~~LOC~~ TREK RX 3.0X15 (BALLOONS) ×1 IMPLANT
CATH INFINITI 5FR ANG PIGTAIL (CATHETERS) IMPLANT
CATH INFINITI 5FR JL4 (CATHETERS) IMPLANT
CATH INFINITI JR4 5F (CATHETERS) IMPLANT
CATH OPTITORQUE JACKY 4.0 5F (CATHETERS) ×2 IMPLANT
CATH VISTA GUIDE 6FR XBLAD3.5 (CATHETERS) ×2 IMPLANT
DEVICE INFLAT 30 PLUS (MISCELLANEOUS) ×2 IMPLANT
DEVICE RAD TR BAND REGULAR (VASCULAR PRODUCTS) ×2 IMPLANT
GLIDESHEATH SLEND SS 6F .021 (SHEATH) ×2 IMPLANT
KIT MANI 3VAL PERCEP (MISCELLANEOUS) ×2 IMPLANT
NEEDLE PERC 18GX7CM (NEEDLE) IMPLANT
PACK CARDIAC CATH (CUSTOM PROCEDURE TRAY) ×2 IMPLANT
SHEATH AVANTI 5FR X 11CM (SHEATH) IMPLANT
STENT XIENCE ALPINE RX 2.5X8 (Permanent Stent) ×2 IMPLANT
STENT XIENCE ALPINE RX 2.75X23 (Permanent Stent) ×2 IMPLANT
WIRE INTUITION PROPEL ST 180CM (WIRE) ×2 IMPLANT
WIRE SAFE-T 1.5MM-J .035X260CM (WIRE) ×2 IMPLANT

## 2015-03-06 NOTE — CV Procedure (Signed)
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 S/p PCI and 2 DES placement to LAD.  Full procedure note to follow.

## 2015-03-06 NOTE — Care Management Note (Signed)
Case Management Note  Patient Details  Name: Jeff Howard MRN: [id number] Date of Birth: 03-29-1961  Subjective/Objective:       Cardiac cath today - out of room.             Action/Plan:   Expected Discharge Date:                  Expected Discharge Plan:     In-House Referral:     Discharge planning Services     Post Acute Care Choice:    Choice offered to:     DME Arranged:    DME Agency:     HH Arranged:    HH Agency:     Status of Service:     Medicare Important Message Given:    Date Medicare IM Given:    Medicare IM give by:    Date Additional Medicare IM Given:    Additional Medicare Important Message give by:     If discussed at Long Length of Stay Meetings, dates discussed:    Additional Comments:  Rockett,Marilyn A, RN 03/06/2015, 9:29 AM

## 2015-03-06 NOTE — Consult Note (Signed)
Cardiology Consultation Note  Patient ID: Jeff Howard, MRN: [id number], DOB/AGE: 1961/02/10 54 y.o. Admit date: 03/05/2015   Date of Consult: 03/06/2015 Primary Physician: [name], MD Primary Cardiologist: New to [facility]  Chief Complaint: Chest pain Reason for Consult: NSTEMI  HPI: 54 y.o. male with h/o DM2, HLD, and HTN who presented to [facility] on 9/25 with onset of chest pain initially on 9/24 x 5-10 minutes and again on 9/25 lasting 1 hour and was found to have a NSTEMI with troponin of 28 at time of consult.   He denies any previously known cardiac history. His wife reports him having had a stress test approximately 3 years prior for an isolated episode of left sided paresthesias. We do not have these results at this time. He works as a Art gallery manager and has been asymptomatic up until this past weekend. He denies any history of tobacco, abuse or illegal drugs. He rarely drinks alcohol on the weekends at a rate of 2 beers with friends. His father had an MI in his late 20's followed by bypass surgery. Per the family the father ultimately had to undergo repeat bypass surgery later in life as well. The father also suffer stroke x 2. Patient's mother has history of HLD, along with his sisters.   He developed pressure-like chest pain lasting 5-10 minutes while at a store walking around on 9/24. There was some associated diaphoresis, otherwise no associated symptoms. He did not think much of this episode and continued on with his daily activities. This was the first time he had ever developed chest pain. While sitting at home watching TV on 9/25 he developed pressure-like chest pain that persisted until he arrived to [facility] and received "medicine." Pain did not radiate. There again was associated diaphoresis, otherwise no associated symptoms. Weight has been declining in the setting of traveler's diarrhea this summer, though this has improved.   Upon his arrival to [facility] he was  found to have a troponin of 0.05-->7.94-->28.04. ECG showed sinus bradycardia, 57 bpm, 1 mm st/t anterior changes with reciprocal inferior st/t changes. CXR negative. He was started on a heparin gtt. He required nitro paste to remain chest pain free. He had one episode of chest pain overnight. He is currently chest pain free.          Past Medical History  Diagnosis Date  . Diabetes mellitus without complication   . Hypertension   . Hyperlipemia       Most Recent Cardiac Studies: None   Surgical History: History reviewed. No pertinent past surgical history.   Home Meds: Prior to Admission medications   Medication Sig Start Date End Date Taking? Authorizing Provider  atorvastatin (LIPITOR) 20 MG tablet Take 20 mg by mouth daily at 6 PM.   Yes Historical Provider, MD  lisinopril (PRINIVIL,ZESTRIL) 5 MG tablet Take 5 mg by mouth daily.   Yes Historical Provider, MD  metFORMIN (GLUCOPHAGE-XR) 500 MG 24 hr tablet Take 2,000 mg by mouth daily after supper.   Yes Historical Provider, MD  pioglitazone (ACTOS) 45 MG tablet Take 45 mg by mouth daily.   Yes Historical Provider, MD  saxagliptin HCl (ONGLYZA) 5 MG TABS tablet Take 5 mg by mouth daily.   Yes Historical Provider, MD    Inpatient Medications:  . aspirin EC  325 mg Oral Daily  . atorvastatin  20 mg Oral q1800  . heparin      . Influenza vac split quadrivalent PF  0.5 mL  Intramuscular Tomorrow-1000  . insulin aspart  0-15 Units Subcutaneous TID WC  . insulin aspart  0-5 Units Subcutaneous QHS  . lisinopril  5 mg Oral Daily  . nitroGLYCERIN  1 inch Topical 4 times per day  . sodium chloride  3 mL Intravenous Q12H   . heparin 900 Units/hr (03/05/15 2142)    Allergies: No Known Allergies  Social History   Social History  . Marital Status: Married    Spouse Name: N/A  . Number of Children: N/A  . Years of Education: N/A   Occupational History  . Not on file.   Social History Main Topics  . Smoking status: Never Smoker    . Smokeless tobacco: Not on file  . Alcohol Use: No  . Drug Use: No  . Sexual Activity: Not on file   Other Topics Concern  . Not on file   Social History Narrative  . No narrative on file     Family History  Problem Relation Age of Onset  . Diabetes Other      Review of Systems: Review of Systems  Constitutional: Positive for weight loss, malaise/fatigue and diaphoresis. Negative for fever and chills.  HENT: Negative for congestion.   Eyes: Negative for discharge and redness.  Respiratory: Negative for cough, hemoptysis, sputum production, shortness of breath and wheezing.   Cardiovascular: Positive for chest pain. Negative for palpitations, orthopnea, claudication, leg swelling and PND.  Gastrointestinal: Negative for heartburn, nausea, vomiting and abdominal pain.  Musculoskeletal: Negative for falls.  Skin: Negative for rash.  Neurological: Positive for weakness. Negative for dizziness, tingling, tremors, sensory change, speech change and focal weakness.  Endo/Heme/Allergies: Does not bruise/bleed easily.  Psychiatric/Behavioral: Negative for substance abuse. The patient is not nervous/anxious.   All other systems reviewed and are negative.   Labs:  Recent Labs  03/05/15 2026 03/06/15 0011 03/06/15 0453  TROPONINI 0.05* 7.94* 28.04*   Lab Results  Component Value Date   WBC 9.4 03/06/2015   HGB 14.0 03/06/2015   HCT 41.5 03/06/2015   MCV 85.5 03/06/2015   PLT 182 03/06/2015    Recent Labs Lab 03/05/15 2026 03/06/15 0453  NA 139 139  K 3.9 4.0  CL 104 107  CO2 27 26  BUN 19 18  CREATININE 0.93 0.76  CALCIUM 9.0 8.5*  PROT 7.4  --   BILITOT 0.4  --   ALKPHOS 68  --   ALT 26  --   AST 28  --   GLUCOSE 141* 134*   Lab Results  Component Value Date   CHOL 140 03/06/2015   HDL 52 03/06/2015   LDLCALC 76 03/06/2015   TRIG 60 03/06/2015   No results found for: DDIMER  Radiology/Studies:  Dg Chest Port 1 View  03/05/2015   CLINICAL DATA:   Chest pain beginning today. Diaphoresis and shortness of breath.  EXAM: PORTABLE CHEST 1 VIEW  COMPARISON:  None.  FINDINGS: The lungs are clear. Heart size normal. No pneumothorax or pleural effusion. No focal bony abnormality.  IMPRESSION: Negative chest.   Electronically Signed   By: Drusilla Kanner M.D.   On: 03/05/2015 20:54    EKG: sinus bradycardia, 57 bpm, 1 mm st/t anterior changes with reciprocal inferior st/t changes    Weights: Filed Weights   03/05/15 2025 03/05/15 2338  Weight: 167 lb (75.751 kg) 171 lb 1.6 oz (77.61 kg)     Physical Exam: Blood pressure 121/76, pulse 57, temperature 97.8 F (36.6 C), temperature source Oral,  resp. rate 20, height  (1.676 m), weight 171 lb 1.6 oz (77.61 kg), SpO2 100 %. Body mass index is 27.63 kg/(m^2). General: Well developed, well nourished, in no acute distress. Head: Normocephalic, atraumatic, sclera non-icteric, no xanthomas, nares are without discharge.  Neck: Negative for carotid bruits. JVD not elevated. Lungs: Clear bilaterally to auscultation without wheezes, rales, or rhonchi. Breathing is unlabored. Heart: RRR with S1 S2. No murmurs, rubs, or gallops appreciated. Abdomen: Soft, non-tender, non-distended with normoactive bowel sounds. No hepatomegaly. No rebound/guarding. No obvious abdominal masses. Msk:  Strength and tone appear normal for age. Extremities: No clubbing or cyanosis. No edema.  Distal pedal pulses are 2+ and equal bilaterally. Neuro: Alert and oriented X 3. No facial asymmetry. No focal deficit. Moves all extremities spontaneously. Psych:  Responds to questions appropriately with a normal affect.    Assessment and Plan:  54 y.o. male with h/o DM2, HLD, and HTN who presented to [facility] on 9/25 with onset of chest pain initially on 9/24 x 5-10 minutes and again on 9/25 lasting 1 hour and was found to have a NSTEMI with troponin of 28 at time of consult.  1. NSTEMI: -Cardiac catheterization this  morning -Heparin gtt -Aspirin  -Currently chest pain free -Hold lisinopril pre-cath -Lipitor, nitro paste, and SL NTG -Risks and benefits of cardiac catheterization have been discussed with the patient including risks of bleeding, bruising, infection, kidney damage, stroke, heart attack, and death. The patient understands these risks and is willing to proceed with the procedure. All questions have been answered and concerns listened to.   2. DM2: -SSI per IM -Add A1C  3. HLD: -LDL 76 -Lipitor as above  4. HTN: -Improved -Likely elevated upon presentation in the setting of patient's pain    Signed, Eula Listen, PA-C Pager: [phone] 03/06/2015, 8:05 AM

## 2015-03-06 NOTE — OR Nursing (Signed)
1615 TR band off, area dressed with guaze & tegaderm. Positive radial pulse.

## 2015-03-06 NOTE — Progress Notes (Signed)
*  PRELIMINARY RESULTS* Echocardiogram 2D Echocardiogram has been performed.  Georgann Housekeeper Hege 03/06/2015, 9:51 AM

## 2015-03-06 NOTE — Progress Notes (Signed)
Pt returned from cath lab. A&O x3. No distress on ra.  Cardiac monitor in place, pt denies chest pain.  Lungs clear bil.  Dressing to rt radial cath site dry and intact, slight edema noted.  Good capillary refill and movement.  Rt hand up on pillow, explained to pt about limited use of rt hand/arm for next 24 hours.  Family at bedside. Denies need. CB in reach, SR up x 2.

## 2015-03-06 NOTE — OR Nursing (Signed)
intrepreter requested by patient, Jeff Howard in and Echo in progress, Telem. Dagoberto Reef called and informed to call [facility] if change in cardiac rhythem as patient is currently monitored off unit

## 2015-03-06 NOTE — Progress Notes (Signed)
ANTICOAGULATION CONSULT NOTE - Initial Consult  Pharmacy Consult for Heparin Indication: chest pain/ACS  No Known Allergies  Patient Measurements: Height:  (167.6 cm) Weight: 171 lb 1.6 oz (77.61 kg) IBW/kg (Calculated) : 63.8 Heparin Dosing Weight: 75.8 kg  Vital Signs: Temp: 97.8 F (36.6 C) (09/26 0408) Temp Source: Oral (09/26 0408) BP: 121/76 mmHg (09/26 0408) Pulse Rate: 57 (09/26 0408)  Labs:  Recent Labs  03/05/15 2026 03/06/15 0011 03/06/15 0453  HGB 14.6  --  14.0  HCT 43.4  --  41.5  PLT 200  --  182  APTT 28  --   --   LABPROT 13.0  --   --   INR 0.96  --   --   HEPARINUNFRC  --   --  0.31  CREATININE 0.93  --  0.76  TROPONINI 0.05* 7.94*  --     Estimated Creatinine Clearance: 101.1 mL/min (by C-G formula based on Cr of 0.76).   Medical History: Past Medical History  Diagnosis Date  . Diabetes mellitus without complication   . Hypertension   . Hyperlipemia     Medications:  Prescriptions prior to admission  Medication Sig Dispense Refill Last Dose  . atorvastatin (LIPITOR) 20 MG tablet Take 20 mg by mouth daily at 6 PM.   03/04/2015 at Unknown time  . lisinopril (PRINIVIL,ZESTRIL) 5 MG tablet Take 5 mg by mouth daily.   03/04/2015 at Unknown time  . metFORMIN (GLUCOPHAGE-XR) 500 MG 24 hr tablet Take 2,000 mg by mouth daily after supper.   03/04/2015 at Unknown time  . pioglitazone (ACTOS) 45 MG tablet Take 45 mg by mouth daily.   03/04/2015 at Unknown time  . saxagliptin HCl (ONGLYZA) 5 MG TABS tablet Take 5 mg by mouth daily.   03/04/2015 at Unknown time    Assessment: Pharmacy consulted to dose heparin in this 54 year old male admitted with ACS/STEMI. CrCl = 101  Goal of Therapy:  Heparin level 0.3-0.7 units/ml Monitor platelets by anticoagulation protocol: Yes   Plan:  Give 4000 units bolus x 1 Start heparin infusion at 900 units/hr  Will order baseline aPTT and INR. Will draw 1st HL 6 hrs after start of drip on 9/26 @  04:00  9/26 AM heparin level 0.31. Recheck in 6 hours to confirm.  Jeff Howard,Jeff Howard 03/06/2015,5:31 AM

## 2015-03-06 NOTE — Progress Notes (Signed)
Informed Dr. Clint Guy of patient's critical troponin 7.94. No new orders patient is currently on a heparin drip.

## 2015-03-07 ENCOUNTER — Encounter: Payer: Self-pay | Admitting: Cardiovascular Disease

## 2015-03-07 DIAGNOSIS — E1151 Type 2 diabetes mellitus with diabetic peripheral angiopathy without gangrene: Secondary | ICD-10-CM

## 2015-03-07 DIAGNOSIS — E1159 Type 2 diabetes mellitus with other circulatory complications: Secondary | ICD-10-CM | POA: Insufficient documentation

## 2015-03-07 DIAGNOSIS — R079 Chest pain, unspecified: Secondary | ICD-10-CM

## 2015-03-07 DIAGNOSIS — I209 Angina pectoris, unspecified: Secondary | ICD-10-CM | POA: Insufficient documentation

## 2015-03-07 DIAGNOSIS — E1165 Type 2 diabetes mellitus with hyperglycemia: Secondary | ICD-10-CM | POA: Insufficient documentation

## 2015-03-07 DIAGNOSIS — E785 Hyperlipidemia, unspecified: Secondary | ICD-10-CM | POA: Insufficient documentation

## 2015-03-07 LAB — GLUCOSE, CAPILLARY
GLUCOSE-CAPILLARY: 113 mg/dL — AB (ref 65–99)
Glucose-Capillary: 166 mg/dL — ABNORMAL HIGH (ref 65–99)
Glucose-Capillary: 114 mg/dL — ABNORMAL HIGH (ref 65–99)
Glucose-Capillary: 132 mg/dL — ABNORMAL HIGH (ref 65–99)

## 2015-03-07 LAB — CBC
HCT: 43.1 % (ref 40.0–52.0)
Hemoglobin: 14.4 g/dL (ref 13.0–18.0)
MCH: 28.7 pg (ref 26.0–34.0)
MCHC: 33.4 g/dL (ref 32.0–36.0)
MCV: 85.9 fL (ref 80.0–100.0)
PLATELETS: 176 10*3/uL (ref 150–440)
RBC: 5.01 MIL/uL (ref 4.40–5.90)
RDW: 14.7 % — ABNORMAL HIGH (ref 11.5–14.5)
WBC: 8.1 10*3/uL (ref 3.8–10.6)

## 2015-03-07 LAB — BASIC METABOLIC PANEL
ANION GAP: 5 (ref 5–15)
BUN: 13 mg/dL (ref 6–20)
CHLORIDE: 106 mmol/L (ref 101–111)
CO2: 27 mmol/L (ref 22–32)
CREATININE: 0.81 mg/dL (ref 0.61–1.24)
Calcium: 8.9 mg/dL (ref 8.9–10.3)
GFR calc non Af Amer: >60 mL/min (ref >60)
Glucose, Bld: 161 mg/dL — ABNORMAL HIGH (ref 65–99)
POTASSIUM: 4.3 mmol/L (ref 3.5–5.1)
SODIUM: 138 mmol/L (ref 135–145)

## 2015-03-07 LAB — HEMOGLOBIN A1C: HEMOGLOBIN A1C: 6.7 % — AB (ref 4.0–6.0)

## 2015-03-07 MED ORDER — PRASUGREL HCL 10 MG PO TABS: 10.0000 mg | ORAL_TABLET | Freq: Every day | ORAL | Status: DC

## 2015-03-07 MED ORDER — ENOXAPARIN SODIUM 40 MG/0.4ML ~~LOC~~ SOLN
40.0000 mg | SUBCUTANEOUS | Status: DC
  Administered 2015-03-07: 40 mg via SUBCUTANEOUS
  Filled 2015-03-07: qty 0.4

## 2015-03-07 MED ORDER — LINAGLIPTIN 5 MG PO TABS
5.0000 mg | ORAL_TABLET | Freq: Every day | ORAL | Status: DC
  Administered 2015-03-07 – 2015-03-08 (×2): 5 mg via ORAL
  Filled 2015-03-07 (×2): qty 1

## 2015-03-07 MED ORDER — KETOTIFEN FUMARATE 0.025 % OP SOLN
1.0000 [drp] | Freq: Two times a day (BID) | OPHTHALMIC | Status: DC | PRN
  Administered 2015-03-07 – 2015-03-08 (×2): 1 [drp] via OPHTHALMIC
  Filled 2015-03-07: qty 5

## 2015-03-07 MED ORDER — METFORMIN HCL 500 MG PO TABS
1000.0000 mg | ORAL_TABLET | Freq: Two times a day (BID) | ORAL | Status: DC
  Administered 2015-03-07 – 2015-03-08 (×2): 1000 mg via ORAL
  Filled 2015-03-07 (×2): qty 2

## 2015-03-07 MED ORDER — ATORVASTATIN CALCIUM 80 MG PO TABS: 80.0000 mg | ORAL_TABLET | Freq: Every day | ORAL | Status: DC

## 2015-03-07 NOTE — Progress Notes (Signed)
[facility] [place] [facility]   PATIENT NAME: Jeff Howard    MR#:  [id number]  DATE OF BIRTH:  12-02-1960  SUBJECTIVE:  CHIEF COMPLAINT:   Chief Complaint  Patient presents with  . Chest Pain   Admitted for chest pain and and NSTEMI. Cardiac catheterization on 03/06/2015. 2 drug eluting stents in LAD. Today he has no chest pain or weakness or shortness of breath.  REVIEW OF SYSTEMS:    Review of Systems  Constitutional: Negative for fever and chills.  HENT: Negative for sore throat.   Eyes: Negative for blurred vision, double vision and pain.  Respiratory: Negative for cough, hemoptysis, shortness of breath and wheezing.   Cardiovascular: Negative for chest pain, palpitations, orthopnea and leg swelling.  Gastrointestinal: Negative for heartburn, nausea, vomiting, abdominal pain, diarrhea and constipation.  Genitourinary: Negative for dysuria and hematuria.  Musculoskeletal: Negative for back pain and joint pain.  Skin: Negative for rash.  Neurological: Negative for sensory change, speech change, focal weakness and headaches.  Endo/Heme/Allergies: Does not bruise/bleed easily.  Psychiatric/Behavioral: Negative for depression. The patient is not nervous/anxious.       DRUG ALLERGIES:  No Known Allergies  VITALS:  Blood pressure 122/77, pulse 58, temperature 98.2 F (36.8 C), temperature source Oral, resp. rate 20, height  (1.676 m), weight 77.61 kg (171 lb 1.6 oz), SpO2 94 %.  PHYSICAL EXAMINATION:   Physical Exam  GENERAL:  54 y.o.-year-old patient lying in the bed with no acute distress.  EYES: Pupils equal, round, reactive to light and accommodation. No scleral icterus. Extraocular muscles intact.  HEENT: Head atraumatic, normocephalic. Oropharynx and nasopharynx clear.  NECK:  Supple, no jugular venous distention. No thyroid enlargement, no tenderness.  LUNGS: Normal breath sounds bilaterally, no wheezing, rales, rhonchi. No  use of accessory muscles of respiration.  CARDIOVASCULAR: S1, S2 normal. No murmurs, rubs, or gallops.  ABDOMEN: Soft, nontender, nondistended. Bowel sounds present. No organomegaly or mass.  EXTREMITIES: No cyanosis, clubbing or edema b/l.    NEUROLOGIC: Cranial nerves II through XII are intact. No focal Motor or sensory deficits b/l.   PSYCHIATRIC: The patient is alert and oriented x 3.  SKIN: No obvious rash, lesion, or ulcer.    LABORATORY PANEL:   CBC  Recent Labs Lab 03/07/15 0423  WBC 8.1  HGB 14.4  HCT 43.1  PLT 176   ------------------------------------------------------------------------------------------------------------------  Chemistries   Recent Labs Lab 03/05/15 2026  03/07/15 0423  NA 139  < > 138  K 3.9  < > 4.3  CL 104  < > 106  CO2 27  < > 27  GLUCOSE 141*  < > 161*  BUN 19  < > 13  CREATININE 0.93  < > 0.81  CALCIUM 9.0  < > 8.9  AST 28  --   --   ALT 26  --   --   ALKPHOS 68  --   --   BILITOT 0.4  --   --   < > = values in this interval not displayed. ------------------------------------------------------------------------------------------------------------------  Cardiac Enzymes  Recent Labs Lab 03/06/15 0453  TROPONINI 28.04*   ------------------------------------------------------------------------------------------------------------------  RADIOLOGY:  Dg Chest Port 1 View  03/05/2015   CLINICAL DATA:  Chest pain beginning today. Diaphoresis and shortness of breath.  EXAM: PORTABLE CHEST 1 VIEW  COMPARISON:  None.  FINDINGS: The lungs are clear. Heart size normal. No pneumothorax or pleural effusion. No focal bony abnormality.  IMPRESSION: Negative chest.  Electronically Signed   By: Drusilla Kanner M.D.   On: 03/05/2015 20:54     ASSESSMENT AND PLAN:   * NSTEMI DES x 2 in LAD. ASA, Effient. No beta blocker due to baseline bradycardia. On statin. Cardiology following patient. Monitor on telemetry floor for the 24  hours. Will need referral to cardiac rehabilitation at discharge.  * DM Restart Metformin and Onglyza today.   All the records are reviewed and case discussed with Care Management/Social Workerr. Management plans discussed with the patient, family and they are in agreement.  CODE STATUS: Full code  DVT Prophylaxis: SCDs  TOTAL TIME TAKING CARE OF THIS PATIENT: 30 minutes.   POSSIBLE D/C TOMORROW DEPENDING ON CLINICAL CONDITION.   Milagros Loll R M.D on 03/07/2015 at 1:19 PM  Between 7am to 6pm - Pager - [phone]  After 6pm go to [email or web address] - password [email or web address] [facility]  [facility]  Office  [phone]  CC: Primary care physician; Shapely-Quinn, Desiree Lucy, MD    Note: This dictation was prepared with Dragon dictation along with smaller phrase technology. Any transcriptional errors that result from this process are unintentional.

## 2015-03-07 NOTE — Progress Notes (Signed)
Patient is hemodynamically stable post cardiac cath and PCI.Radial cath site is clean and clear of hematoma,no pain and dressing intact.

## 2015-03-07 NOTE — Care Management (Signed)
Patient is to discharge home on Effient.  Attempts to obtain copay/coverage information from [facility] unsuccessful. Spoke with patient's pharmacy- [facility] in [place].  Copay appears to be 60 dollars.  Patient has been provided the 30 day free coupon and a copay assist coupon.  Pharmacy confirms they have this drug in stock

## 2015-03-07 NOTE — Progress Notes (Signed)
Patient: Jeff Howard / Admit Date: 03/05/2015 / Date of Encounter: 03/07/2015, 7:58 AM   Subjective: Status post cardiac cath on 9/26 that showed 95% mid LAD stenosis and moderate LCx diseaes. S/p PCI and 2 overlapping DES to LAD. EF 45-50% by LV gram. Mildly elevated LVEDP. Echo showed EF 45-50%, mild HK of the apicalanterior myocardium, severe HK of the apical myocardium. Trivial TR. Has ambulated in his room without any chest pain or SOB. No complaints this morning. Stable renal function and blood counts.   Review of Systems: Review of Systems  Constitutional: Negative for fever, chills, weight loss, malaise/fatigue and diaphoresis.  HENT: Negative for congestion.   Eyes: Negative for discharge and redness.  Respiratory: Negative for cough, hemoptysis, sputum production, shortness of breath and wheezing.   Cardiovascular: Negative for chest pain, palpitations, orthopnea, claudication, leg swelling and PND.  Gastrointestinal: Negative for heartburn, nausea and vomiting.  Musculoskeletal: Negative for falls.  Skin: Negative for rash.  Neurological: Negative for dizziness, tingling, tremors, sensory change, speech change, focal weakness, loss of consciousness and weakness.  Endo/Heme/Allergies: Does not bruise/bleed easily.  Psychiatric/Behavioral: The patient is not nervous/anxious.      Objective: Telemetry: sinus bradycardia, upper 50's Physical Exam: Blood pressure 108/61, pulse 57, temperature 98.5 F (36.9 C), temperature source Oral, resp. rate 20, height  (1.676 m), weight 171 lb 1.6 oz (77.61 kg), SpO2 97 %. Body mass index is 27.63 kg/(m^2). General: Well developed, well nourished, in no acute distress. Head: Normocephalic, atraumatic, sclera non-icteric, no xanthomas, nares are without discharge. Neck: Negative for carotid bruits. JVP not elevated. Lungs: Clear bilaterally to auscultation without wheezes, rales, or rhonchi. Breathing is unlabored. Heart: RRR S1 S2  without murmurs, rubs, or gallops.  Abdomen: Soft, non-tender, non-distended with normoactive bowel sounds. No rebound/guarding. Extremities: No clubbing or cyanosis. No edema. Distal pedal pulses are 2+ and equal bilaterally. Cath site without active bleeding, bruising, swelling, or TTP. 2+ distal pulse.  Neuro: Alert and oriented X 3. Moves all extremities spontaneously. Psych:  Responds to questions appropriately with a normal affect.   Intake/Output Summary (Last 24 hours) at 03/07/15 0758 Last data filed at 03/07/15 0515  Gross per 24 hour  Intake 634.33 ml  Output   1540 ml  Net -905.67 ml    Inpatient Medications:  . aspirin  81 mg Oral Daily  . atorvastatin  80 mg Oral q1800  . Influenza vac split quadrivalent PF  0.5 mL Intramuscular Tomorrow-1000  . insulin aspart  0-15 Units Subcutaneous TID WC  . insulin aspart  0-5 Units Subcutaneous QHS  . lisinopril  10 mg Oral Daily  . prasugrel  10 mg Oral Daily  . sodium chloride  3 mL Intravenous Q12H  . sodium chloride  3 mL Intravenous Q12H   Infusions:    Labs:  Recent Labs  03/06/15 0453 03/07/15 0423  NA 139 138  K 4.0 4.3  CL 107 106  CO2 26 27  GLUCOSE 134* 161*  BUN 18 13  CREATININE 0.76 0.81  CALCIUM 8.5* 8.9    Recent Labs  03/05/15 2026  AST 28  ALT 26  ALKPHOS 68  BILITOT 0.4  PROT 7.4  ALBUMIN 4.3    Recent Labs  03/05/15 2026 03/06/15 0453 03/07/15 0423  WBC 8.8 9.4 8.1  NEUTROABS 2.9  --   --   HGB 14.6 14.0 14.4  HCT 43.4 41.5 43.1  MCV 86.7 85.5 85.9  PLT 200 182 176  Recent Labs  03/05/15 2026 03/06/15 0011 03/06/15 0453  TROPONINI 0.05* 7.94* 28.04*   Invalid input(s): POCBNP No results for input(s): HGBA1C in the last 72 hours.   Weights: Filed Weights   03/05/15 2025 03/05/15 2338  Weight: 167 lb (75.751 kg) 171 lb 1.6 oz (77.61 kg)     Radiology/Studies:  Dg Chest Port 1 View  03/05/2015   CLINICAL DATA:  Chest pain beginning today. Diaphoresis and  shortness of breath.  EXAM: PORTABLE CHEST 1 VIEW  COMPARISON:  None.  FINDINGS: The lungs are clear. Heart size normal. No pneumothorax or pleural effusion. No focal bony abnormality.  IMPRESSION: Negative chest.   Electronically Signed   By: Drusilla Kanner M.D.   On: 03/05/2015 20:54     Assessment and Plan  54 y.o. male with h/o DM2, HLD, and HTN who presented to [facility] on 9/25 with onset of chest pain initially on 9/24 x 5-10 minutes and again on 9/25 lasting 1 hour and was found to have a NSTEMI with troponin of 28 at time of consult.  1. NSTEMI: -Status post cardiac catheterization on 9/26 that showed 99% stenosis of the mid LAD s/p PCI and 2 overlapping DES, mid LCx, 60% stenosis, ostial diag lesion to 2nd diag lesion 20%, mid RCA 30%, RPDA-1 lesion 40%, RPDA-2 lesion 40%, EF 45-50%, mildly elevated LVEDP -DAPT for at least the next 12 months with aspirin 81 mg daily and Effient 10 mg daily -Currently chest pain free -Bradycardic rate currently precludes initiation of beta blocker. Wife states he runs bradycardic at baseline -Lipitor has been increased to 80 mg daily to reach goal LDL of <70 -Continue lisinopril 10 mg daily and SL NTG prn -Would monitor as an inpatient for 24 more hours given size, location, and inability to start beta blocker as above then possible discharge on 9/28 with close outpatient follow up -Cardiac rehab as an outpatient   2. Mild ischemic cardiomyopathy: -Not volume overloaded -Continue current medications as above -Start low dose Coreg when pulse allows as above -Continue lisinopril as above -Echo per HPI  3. DM2: -SSI per IM -Add A1C  4. HLD: -LDL 76 -Lipitor increased as above  5. HTN: -Improved -Likely elevated upon presentation in the setting of patient's pain    Signed, Eula Listen, PA-C Pager: [phone] 03/07/2015, 7:58 AM

## 2015-03-07 NOTE — Discharge Instructions (Signed)
°  DIET:  Diabetic diet  DISCHARGE CONDITION:  Stable  ACTIVITY:  Activity as tolerated and No lifting, or strenuous exercise for 2 weeks  OXYGEN:  Home Oxygen: No.   Oxygen Delivery: room air  DISCHARGE LOCATION:  home   If you experience worsening of your admission symptoms, develop shortness of breath, life threatening emergency, suicidal or homicidal thoughts you must seek medical attention immediately by calling 911 or calling your MD immediately  if symptoms less severe.  You Must read complete instructions/literature along with all the possible adverse reactions/side effects for all the Medicines you take and that have been prescribed to you. Take any new Medicines after you have completely understood and accpet all the possible adverse reactions/side effects.   Please note  You were cared for by a hospitalist during your hospital stay. If you have any questions about your discharge medications or the care you received while you were in the hospital after you are discharged, you can call the unit and asked to speak with the hospitalist on call if the hospitalist that took care of you is not available. Once you are discharged, your primary care physician will handle any further medical issues. Please note that NO REFILLS for any discharge medications will be authorized once you are discharged, as it is imperative that you return to your primary care physician (or establish a relationship with a primary care physician if you do not have one) for your aftercare needs so that they can reassess your need for medications and monitor your lab values.

## 2015-03-08 ENCOUNTER — Telehealth: Payer: Self-pay

## 2015-03-08 LAB — GLUCOSE, CAPILLARY
GLUCOSE-CAPILLARY: 129 mg/dL — AB (ref 65–99)
Glucose-Capillary: 144 mg/dL — ABNORMAL HIGH (ref 65–99)

## 2015-03-08 LAB — TROPONIN I: TROPONIN I: 6.27 ng/mL — AB (ref <0.031)

## 2015-03-08 MED ORDER — ASPIRIN 81 MG PO CHEW: 81.0000 mg | CHEWABLE_TABLET | Freq: Every day | ORAL | Status: DC

## 2015-03-08 MED ORDER — METOPROLOL TARTRATE 25 MG PO TABS: 12.5000 mg | ORAL_TABLET | Freq: Two times a day (BID) | ORAL | Status: DC

## 2015-03-08 NOTE — Progress Notes (Signed)
Discharge instructions along with home medication list and follow up gone over with patient and wife. Vascular instructions also gone over with patient. Made patient aware prescriptions were electronically submitted per md. Work note also given to patient. Iv and telemetry removed. Dressing to right radial dry and intact no bleeding noted. Patient to be discharged home on ra. No distress noted no c/o pain.

## 2015-03-08 NOTE — Progress Notes (Signed)
Patient: Jeff Howard / Admit Date: 03/05/2015 / Date of Encounter: 03/08/2015, 7:43 AM   Subjective: Feels well this morning. Has ambulated in the hallways without issues. No chest pain, SOB, palpitations, or diaphoresis. Heart rate continues to remain in the upper to mid 50's, though this is his baseline, asymptomatic. Ready to go home.   Review of Systems: Review of Systems  Constitutional: Negative for fever, chills, weight loss, malaise/fatigue and diaphoresis.  HENT: Negative for congestion.   Eyes: Negative for discharge and redness.  Respiratory: Negative for cough, hemoptysis, sputum production, shortness of breath and wheezing.   Cardiovascular: Negative for chest pain, palpitations, orthopnea, claudication, leg swelling and PND.  Gastrointestinal: Negative for nausea, vomiting and abdominal pain.  Musculoskeletal: Negative for falls.  Skin: Negative for rash.  Neurological: Negative for dizziness, sensory change, speech change, focal weakness, loss of consciousness and weakness.  Endo/Heme/Allergies: Does not bruise/bleed easily.  Psychiatric/Behavioral: The patient is not nervous/anxious.     Objective: Telemetry: sinus bradycardia, upper 50's Physical Exam: Blood pressure 114/73, pulse 54, temperature 98.4 F (36.9 C), temperature source Oral, resp. rate 16, height  (1.676 m), weight 171 lb 1.6 oz (77.61 kg), SpO2 99 %. Body mass index is 27.63 kg/(m^2). General: Well developed, well nourished, in no acute distress. Head: Normocephalic, atraumatic, sclera non-icteric, no xanthomas, nares are without discharge. Neck: Negative for carotid bruits. JVP not elevated. Lungs: Clear bilaterally to auscultation without wheezes, rales, or rhonchi. Breathing is unlabored. Heart: RRR S1 S2 without murmurs, rubs, or gallops.  Abdomen: Soft, non-tender, non-distended with normoactive bowel sounds. No rebound/guarding. Extremities: No clubbing or cyanosis. No edema. Distal  pedal pulses are 2+ and equal bilaterally. Cath site without active bleeding, bruising, swelling, or TTP. Distal pulse 2+.  Neuro: Alert and oriented X 3. Moves all extremities spontaneously. Psych:  Responds to questions appropriately with a normal affect.   Intake/Output Summary (Last 24 hours) at 03/08/15 0743 Last data filed at 03/07/15 1711  Gross per 24 hour  Intake    723 ml  Output    125 ml  Net    598 ml    Inpatient Medications:  . aspirin  81 mg Oral Daily  . atorvastatin  80 mg Oral q1800  . enoxaparin (LOVENOX) injection  40 mg Subcutaneous Q24H  . Influenza vac split quadrivalent PF  0.5 mL Intramuscular Tomorrow-1000  . insulin aspart  0-15 Units Subcutaneous TID WC  . insulin aspart  0-5 Units Subcutaneous QHS  . linagliptin  5 mg Oral Daily  . lisinopril  10 mg Oral Daily  . metFORMIN  1,000 mg Oral BID WC  . prasugrel  10 mg Oral Daily  . sodium chloride  3 mL Intravenous Q12H  . sodium chloride  3 mL Intravenous Q12H   Infusions:    Labs:  Recent Labs  03/06/15 0453 03/07/15 0423  NA 139 138  K 4.0 4.3  CL 107 106  CO2 26 27  GLUCOSE 134* 161*  BUN 18 13  CREATININE 0.76 0.81  CALCIUM 8.5* 8.9    Recent Labs  03/05/15 2026  AST 28  ALT 26  ALKPHOS 68  BILITOT 0.4  PROT 7.4  ALBUMIN 4.3    Recent Labs  03/05/15 2026 03/06/15 0453 03/07/15 0423  WBC 8.8 9.4 8.1  NEUTROABS 2.9  --   --   HGB 14.6 14.0 14.4  HCT 43.4 41.5 43.1  MCV 86.7 85.5 85.9  PLT 200 182 176  Recent Labs  03/05/15 2026 03/06/15 0011 03/06/15 0453 03/08/15 0444  TROPONINI 0.05* 7.94* 28.04* 6.27*   Invalid input(s): POCBNP  Recent Labs  03/07/15 0423  HGBA1C 6.7*     Weights: Filed Weights   03/05/15 2025 03/05/15 2338  Weight: 167 lb (75.751 kg) 171 lb 1.6 oz (77.61 kg)     Radiology/Studies:  Dg Chest Port 1 View  03/05/2015   CLINICAL DATA:  Chest pain beginning today. Diaphoresis and shortness of breath.  EXAM: PORTABLE CHEST 1  VIEW  COMPARISON:  None.  FINDINGS: The lungs are clear. Heart size normal. No pneumothorax or pleural effusion. No focal bony abnormality.  IMPRESSION: Negative chest.   Electronically Signed   By: Drusilla Kanner M.D.   On: 03/05/2015 20:54     Assessment and Plan  54 y.o. male with h/o DM2, HLD, and HTN who presented to [facility] on 9/25 with onset of chest pain initially on 9/24 x 5-10 minutes and again on 9/25 lasting 1 hour and was found to have a NSTEMI with troponin of 28 at time of consult.  1. NSTEMI: -Status post cardiac catheterization on 9/26 that showed 99% stenosis of the mid LAD s/p PCI and 2 overlapping DES, mid LCx, 60% stenosis, ostial diag lesion to 2nd diag lesion 20%, mid RCA 30%, RPDA-1 lesion 40%, RPDA-2 lesion 40%, EF 45-50%, mildly elevated LVEDP -DAPT for at least the next 12 months with aspirin 81 mg daily and Effient 10 mg daily. The importance of taking these medications everyday was discussed in detail. Coupon card has been given to the patient by Case Manager -Currently chest pain free with ambulation  -Bradycardic rate currently precludes initiation of beta blocker. Wife states he runs bradycardic at baseline -Lipitor has been increased to 80 mg daily to reach goal LDL of <70 -Continue lisinopril 10 mg daily and SL NTG prn -Now 48 hours post cardiac cath without arrhythmia and has ambulated without symptoms -Once has been seen by Dr. Mariah Milling, MD if he agrees should be ok for discharge  -Will need to be out of work until he rechecks with [place] in 2 weeks, will need a note -Consider cardiac rehab as an outpatient   2. Mild ischemic cardiomyopathy: -Not volume overloaded -Continue current medications as above -Start low dose Coreg when/if pulse allows as above -Continue lisinopril as above -Echo per priorHPI  3. DM2: -SSI per IM -A1C 6.7%  4. HLD: -LDL 76 -Lipitor increased as above  5. HTN: -Improved -Likely elevated upon presentation in the setting of  patient's pain    Signed, Eula Listen, PA-C Pager: [phone] 03/08/2015, 7:43 AM

## 2015-03-08 NOTE — Telephone Encounter (Signed)
Patient contacted regarding discharge from [facility] on 03/08/15.  Patient understands to follow up with Ward Givens, NP on 03/20/15 at 2:00 at [facility]. Patient understands discharge instructions? yes Patient understands medications and regiment? yes Patient understands to bring all medications to this visit? yes

## 2015-03-08 NOTE — Telephone Encounter (Signed)
-----   Message from Coralee Rud sent at 03/08/2015  9:54 AM EDT ----- Regarding: tcm/ph 03/20/2015 2:00 Ward Givens, NP

## 2015-03-08 NOTE — Progress Notes (Signed)
[facility]         [place], [place].   03/08/2015  Patient: Jeff Howard   Date of Birth:  [birth year]  Date of admission:  03/05/2015  Date of Discharge  03/08/2015    To Whom it May Concern:   [name]  may return to work on 03/22/2015.   If you have any questions or concerns, please don't hesitate to call.  Sincerely,   Milagros Loll R M.D Pager [phone] Office : [phone]   .

## 2015-03-10 NOTE — Discharge Summary (Signed)
[facility] -  at [facility]   PATIENT NAME: Jeff Howard    MR#:  [id number]  DATE OF BIRTH:  Arval Brandstetter 08, 1962  DATE OF ADMISSION:  03/05/2015 ADMITTING PHYSICIAN: Wyatt Haste, MD  DATE OF DISCHARGE: 03/08/2015 12:46 PM  PRIMARY CARE PHYSICIAN: [name], MD    ADMISSION DIAGNOSIS:  Chest pain, unspecified chest pain type [R07.9]  DISCHARGE DIAGNOSIS:  Principal Problem:   NSTEMI (non-ST elevated myocardial infarction) Active Problems:   Pain in the chest   Angina pectoris   Poorly controlled type 2 diabetes mellitus with circulatory disorder   Hyperlipidemia   SECONDARY DIAGNOSIS:   Past Medical History  Diagnosis Date  . Diabetes mellitus without complication   . Hypertension   . Hyperlipemia      ADMITTING HISTORY  Jeff Howard is a 54 y.o. male with a known history of type 2 diabetes non-insulin-requiring, essential hypertension presenting with chest pain. He describes acute onset chest pain which occurred approximately 8 PM at rest, retrosternal in location, pressure in quality, 8/10 intensity and no worsening or relieving factors nonradiating had some associated shortness of breath. Currently chest pain-free emergency department course noted mildly elevated troponin 0.05 subsequent started on therapeutic heparin by ED staff   HOSPITAL COURSE:   * NSTEMI DES x 2 in LAD. ASA, Effient. No beta blocker due to baseline bradycardia. On statin. Changed to higher dose. Cardiology following patient. Monitor on telemetry floor for the 24 hours. Referral to cardiac rehabilitation at discharge.  * DM Restarted Metformin and Onglyza today.   CONSULTS OBTAINED:  Treatment Team:  Wyatt Haste, MD Iran Ouch, MD  DRUG ALLERGIES:  No Known Allergies  DISCHARGE MEDICATIONS:   Discharge Medication List as of 03/08/2015 12:03 PM    START taking these medications   Details  aspirin 81 MG chewable tablet  Chew 1 tablet (81 mg total) by mouth daily., Starting 03/08/2015, Until Discontinued, No Print    metoprolol tartrate (LOPRESSOR) 25 MG tablet Take 0.5 tablets (12.5 mg total) by mouth 2 (two) times daily., Starting 03/08/2015, Until Discontinued, Normal    prasugrel (EFFIENT) 10 MG TABS tablet Take 1 tablet (10 mg total) by mouth daily., Starting 03/07/2015, Until Discontinued, Normal      CONTINUE these medications which have CHANGED   Details  atorvastatin (LIPITOR) 80 MG tablet Take 1 tablet (80 mg total) by mouth daily at 6 PM., Starting 03/07/2015, Until Discontinued, Normal      CONTINUE these medications which have NOT CHANGED   Details  lisinopril (PRINIVIL,ZESTRIL) 5 MG tablet Take 5 mg by mouth daily., Until Discontinued, Historical Med    metFORMIN (GLUCOPHAGE-XR) 500 MG 24 hr tablet Take 2,000 mg by mouth daily after supper., Until Discontinued, Historical Med    pioglitazone (ACTOS) 45 MG tablet Take 45 mg by mouth daily., Until Discontinued, Historical Med    saxagliptin HCl (ONGLYZA) 5 MG TABS tablet Take 5 mg by mouth daily., Until Discontinued, Historical Med         Today    VITAL SIGNS:  Blood pressure 118/68, pulse 74, temperature 97.9 F (36.6 C), temperature source Oral, resp. rate 18, height  (1.676 m), weight 77.61 kg (171 lb 1.6 oz), SpO2 97 %.  I/O:  No intake or output data in the 24 hours ending 03/10/15 1438  PHYSICAL EXAMINATION:  Physical Exam  GENERAL:  54 y.o.-year-old patient lying in the bed with no acute distress.  LUNGS: Normal breath sounds bilaterally,  no wheezing, rales,rhonchi or crepitation. No use of accessory muscles of respiration.  CARDIOVASCULAR: S1, S2 normal. No murmurs, rubs, or gallops.  ABDOMEN: Soft, non-tender, non-distended. Bowel sounds present. No organomegaly or mass.  NEUROLOGIC: Moves all 4 extremities. PSYCHIATRIC: The patient is alert and oriented x 3.  SKIN: No obvious rash, lesion, or ulcer.   DATA  REVIEW:   CBC  Recent Labs Lab 03/07/15 0423  WBC 8.1  HGB 14.4  HCT 43.1  PLT 176    Chemistries   Recent Labs Lab 03/05/15 2026  03/07/15 0423  NA 139  < > 138  K 3.9  < > 4.3  CL 104  < > 106  CO2 27  < > 27  GLUCOSE 141*  < > 161*  BUN 19  < > 13  CREATININE 0.93  < > 0.81  CALCIUM 9.0  < > 8.9  AST 28  --   --   ALT 26  --   --   ALKPHOS 68  --   --   BILITOT 0.4  --   --   < > = values in this interval not displayed.  Cardiac Enzymes  Recent Labs Lab 03/08/15 0444  TROPONINI 6.27*    Microbiology Results  No results found for this or any previous visit.  RADIOLOGY:  No results found.    Follow up with PCP in 1 week.  Management plans discussed with the patient, family and they are in agreement.  CODE STATUS:   TOTAL TIME TAKING CARE OF THIS PATIENT ON DAY OF DISCHARGE: more than 30 minutes.    Milagros Loll R M.D on 03/10/2015 at 2:38 PM  Between 7am to 6pm - Pager - [phone]  After 6pm go to [email or web address] - password [email or web address] [facility]  [place] [facility]  Office  [phone]  CC: Primary care physician; Shapely-Quinn, Desiree Lucy, MD     Note: This dictation was prepared with Dragon dictation along with smaller phrase technology. Any transcriptional errors that result from this process are unintentional.

## 2015-03-13 ENCOUNTER — Telehealth: Payer: Self-pay | Admitting: *Deleted

## 2015-03-13 NOTE — Telephone Encounter (Signed)
Left message on machine for patient to contact the office.   

## 2015-03-13 NOTE — Telephone Encounter (Signed)
Patient's heart rate has been at 47 or 48 in the evening. Should he take his second dose of metoprolol 12.5mg  in the evening ? Please call

## 2015-03-14 NOTE — Telephone Encounter (Signed)
Patient wife returning call .  tx sharon

## 2015-03-14 NOTE — Telephone Encounter (Signed)
S/w pt wife who returned call.  States pt HR in upper 40's in the evenings. Advised holding metoprolol when HR is this low. Pt to consider holding if HR <60bpm. Confirmed appt w/Chris Brion Aliment, NP Wife had no further questions.

## 2015-03-20 ENCOUNTER — Encounter: Payer: 59 | Admitting: Nurse Practitioner

## 2015-03-22 ENCOUNTER — Encounter: Payer: Self-pay | Admitting: Cardiovascular Disease

## 2015-03-24 ENCOUNTER — Encounter: Payer: Self-pay | Admitting: Nurse Practitioner

## 2015-03-24 ENCOUNTER — Other Ambulatory Visit: Payer: Self-pay

## 2015-03-24 ENCOUNTER — Ambulatory Visit (INDEPENDENT_AMBULATORY_CARE_PROVIDER_SITE_OTHER): Payer: 59 | Admitting: Nurse Practitioner

## 2015-03-24 VITALS — BP 110/64 | HR 53 | Ht 66.0 in | Wt 165.0 lb

## 2015-03-24 DIAGNOSIS — E785 Hyperlipidemia, unspecified: Secondary | ICD-10-CM | POA: Diagnosis not present

## 2015-03-24 DIAGNOSIS — I10 Essential (primary) hypertension: Secondary | ICD-10-CM

## 2015-03-24 DIAGNOSIS — I214 Non-ST elevation (NSTEMI) myocardial infarction: Secondary | ICD-10-CM | POA: Diagnosis not present

## 2015-03-24 DIAGNOSIS — I255 Ischemic cardiomyopathy: Secondary | ICD-10-CM

## 2015-03-24 DIAGNOSIS — I2510 Atherosclerotic heart disease of native coronary artery without angina pectoris: Secondary | ICD-10-CM

## 2015-03-24 DIAGNOSIS — E119 Type 2 diabetes mellitus without complications: Secondary | ICD-10-CM

## 2015-03-24 MED ORDER — METOPROLOL TARTRATE 25 MG PO TABS: 12.5000 mg | ORAL_TABLET | Freq: Two times a day (BID) | ORAL | Status: DC

## 2015-03-24 MED ORDER — PRASUGREL HCL 10 MG PO TABS: 10.0000 mg | ORAL_TABLET | Freq: Every day | ORAL | Status: DC

## 2015-03-24 MED ORDER — NITROGLYCERIN 0.4 MG SL SUBL: 0.4000 mg | SUBLINGUAL_TABLET | SUBLINGUAL | Status: DC | PRN

## 2015-03-24 NOTE — Progress Notes (Signed)
Patient Name: Jeff Howard Date of Encounter: 03/24/2015  Primary Care Provider:  Nonda LouShapely-Quinn, Todd Greencastle, MD Primary Cardiologist:  Judie PetitM. Kirke CorinArida, MD   Chief Complaint  54 y/o male s/p recent NSTEMI who presents for f/u.  Past Medical History   Past Medical History  Diagnosis Date  . Diabetes mellitus without complication (HCC)   . Essential hypertension   . Hyperlipemia   . CAD (coronary artery disease)     a. 02/2015 NSTEMI/PCI: LM nl, LAD 4565m (2.75x23 Xience/2.5x8 Xience DES), D2 20ost, LCX 1724m, RCA 5958m, RPDA1/2 40, EF 45-50.  . Ischemic cardiomyopathy     a. 02/2015 Echo: Ef 45-50%, mild apicalanterior HK, sev apical HK, triv MR.   Past Surgical History  Procedure Laterality Date  . Cardiac catheterization N/A 03/06/2015    Procedure: Left Heart Cath and Coronary Angiography;  Surgeon: Iran OuchMuhammad A Arida, MD;  Location: [facility] INVASIVE CV LAB;  Service: Cardiovascular;  Laterality: N/A;  . Cardiac catheterization N/A 03/06/2015    Procedure: Coronary Stent Intervention;  Surgeon: Iran OuchMuhammad A Arida, MD;  Location: [facility] INVASIVE CV LAB;  Service: Cardiovascular;  Laterality: N/A;  . Arm surgery      left    Allergies  No Known Allergies  HPI  54 y/o male who [facility] with chest pain and r/i for NSTEMI.  Cath revealed severe LAD dzs, which was successfully treated with 2 Xience DESs.  He otw had nonobs dzs.  EF was 45-50% by echo.  He was subsequently d/c'd and has mostly done well.  He is walking 3 miles/day w/o chest pain or dyspnea.  He says that he is doing this at a slow pace and reassures me that he is not pushing it too much.  He isn't sure if his work life will allow for him to enroll in cardiac rehab.  His wife is a Engineer, civil (consulting)nurse and has been holding his PM dose of BB therapy b/c his HR has been in the high 40's in the afternoon.  It is usually in the 50's in the AM and she is comfortable holding doses for HR's less than 50.    Yesterday, [name] had a  fleeting episode of sharp/shooting chest pain, which lasted a few mins.  He walked last night and then walked 2 miles this AM w/o any recurrence.  He denies palpitations, dyspnea, pnd, orthopnea, n, v, dizziness, syncope, edema, weight gain, or early satiety.   Home Medications  Prior to Admission medications   Medication Sig Start Date End Date Taking? Authorizing Provider  aspirin 81 MG tablet Take 81 mg by mouth daily.   Yes Historical Provider, MD  atorvastatin (LIPITOR) 80 MG tablet Take 1 tablet (80 mg total) by mouth daily at 6 PM. 03/07/15  Yes Srikar Sudini, MD  lisinopril (PRINIVIL,ZESTRIL) 5 MG tablet Take 5 mg by mouth daily.   Yes Historical Provider, MD  metFORMIN (GLUCOPHAGE-XR) 500 MG 24 hr tablet Take 2,000 mg by mouth daily after supper.   Yes Historical Provider, MD  pioglitazone (ACTOS) 45 MG tablet Take 45 mg by mouth daily.   Yes Historical Provider, MD  saxagliptin HCl (ONGLYZA) 5 MG TABS tablet Take 5 mg by mouth daily.   Yes Historical Provider, MD  metoprolol tartrate (LOPRESSOR) 25 MG tablet Take 0.5 tablets (12.5 mg total) by mouth 2 (two) times daily. 03/24/15   Ok Anishristopher R Marcey Persad, NP  prasugrel (EFFIENT) 10 MG TABS tablet Take 1 tablet (10 mg total) by mouth daily.  03/24/15   Ok Anis, NP    Review of Systems  One episode of c/p yesterday.  He denies palpitations, dyspnea, pnd, orthopnea, n, v, dizziness, syncope, edema, weight gain, or early satiety.  All other systems reviewed and are otherwise negative except as noted above.  Physical Exam  VS:  BP 110/64 mmHg  Pulse 53  Ht  (1.676 m)  Wt 165 lb (74.844 kg)  BMI 26.64 kg/m2 , BMI Body mass index is 26.64 kg/(m^2). GEN: Well nourished, well developed, in no acute distress. HEENT: normal. Neck: Supple, no JVD, carotid bruits, or masses. Cardiac: RRR, no murmurs, rubs, or gallops. No clubbing, cyanosis, edema.  Radials/DP/PT 2+ and equal bilaterally.  r wrist cath site w/o  bleeding/bruit/hematoma.  Respiratory:  Respirations regular and unlabored, clear to auscultation bilaterally. GI: Soft, nontender, nondistended, BS + x 4. MS: no deformity or atrophy. Skin: warm and dry, no rash. Neuro:  Strength and sensation are intact. Psych: Normal affect.  Accessory Clinical Findings  ECG - SB, 53, anterior infarct with anterolateral TWI - unchanged c/w d/c ecg.  Assessment & Plan  1.  NSTEMI, subsequent episode of care/CAD:  S/p PCI/DES x 2 to the LAD in late Sept.  He has been active w/o exertional c/p, though he did have a somewhat fleeting episode of sharp c/p at rest yesterday.  He walked 2 miles this AM w/o recurrence.  ECG is unchanged from d/c, though is abnl @ baseline.  He does not think that c/p was similar to prior angina.  He does not need relook cath at this time as c/p yesterday was somewhat atypical.  If however, he has recurrent or recalcitrant c/p, he knows to call 911.  Cont asa, statin, effient, acei, nd low dose bb.  He has baseline bradycardia and his wife has been monitoring his HR prior to bb doses and holding if necessary.  I will Rx sl NTG today.  He is considering cardiac rehab but isn't sure that it will fit into his work life.  2.  Essential HTN:  Stable on low dose bb and acei.  3. HL:  LDL 76.  He will need f/u lipids and lft's when he sees Dr. Kirke Corin back in 6 wks.  4.  ICM:  Ef 45-50% by echo.  Euvolemic on exam.  Cont bb/acei.  5.  Type II DM:  Managed by primary care.  Consider d/c'ing actos in setting of mild LV dysfxn.  6.  Dispo:  F/U w/ Dr. Kirke Corin in 6 wks or sooner if necessary.  Nicolasa Ducking, NP 03/24/2015, 12:46 PM

## 2015-03-24 NOTE — Patient Instructions (Signed)
Medication Instructions:  Your physician recommends that you continue on your current medications as directed. Please refer to the Current Medication list given to you today.   Labwork: none  Testing/Procedures: none  Follow-Up: Your physician recommends that you schedule a follow-up appointment in: six weeks with Dr. Kirke CorinArida.    Any Other Special Instructions Will Be Listed Below (If Applicable).

## 2015-03-31 ENCOUNTER — Telehealth: Payer: Self-pay | Admitting: *Deleted

## 2015-03-31 ENCOUNTER — Other Ambulatory Visit: Payer: Self-pay

## 2015-03-31 ENCOUNTER — Telehealth: Payer: Self-pay

## 2015-03-31 MED ORDER — CLOPIDOGREL BISULFATE 75 MG PO TABS: 75.0000 mg | ORAL_TABLET | Freq: Every day | ORAL | Status: DC

## 2015-03-31 NOTE — Telephone Encounter (Signed)
Jeff SaasHiraa M Howard at 03/31/2015 9:23 AM     Status: Signed       Expand All Collapse All   Pt wife calling stating that pt is started developing rashes all over, started Wednesday It went away and then came back Went to PCP and they took pt off blood thinner. Pt states this morning the rashes are a bit down but now he is having swelling in hands  +2 edema left hand +1 edema  They were told to be off it 2 w and would like to know if this is okay and what else can be doing She also states he had some dizzy ness Wednesday night. PCP said it has to be because of the blood thinner.  Today he is feeling a bit weak, and he would like another work note for he is suppose to go back Monday but thinks he needs another few days off Please advise.        ( First phone note originally placed in pt's other chart which is not current) S/w pt wife Jeff ElmSylvia who reports all-over rash on Wed, improved today. Wife states yesterday, PCP advised pt to stop Effient for two weeks but she is concerned due to Sept admission for NSTEMI. Did not take morning dose. Taking all other medications as directed. Also reports swelling in hands. Thinks it could be related to Effient. Reports one episode of dizziness Wed. Pt feels week and not ready to go back to work. Wife asks for a note stating pt needs a few more days off. Forward to [employer]

## 2015-03-31 NOTE — Telephone Encounter (Signed)
S/w pt wife Nettie ElmSylvia who states PCP, Noel Journeyodd Shapley-Quinn, advised discontinuing Effient. Reviewed Ryan's recommendations.  Wife agreeable to plan Prescription submitted to [facility] pharmacy Letter written for pt wife to pick up at lunchtime.

## 2015-03-31 NOTE — Telephone Encounter (Signed)
S/w wife. Pt last name "Jeff Howard" MRN [id number] See phone note in correct chart

## 2015-03-31 NOTE — Telephone Encounter (Signed)
Patient is only 1 month out from PCI/DES x 2 to the LAD. He should be on dual antiplatelet therapy for at least 12 months. One antiplatelet medication is subtherapeutic. If PCP believes he cannot take Effient he should have been started on another at that time or advised to see cardiology for further evaluation and treatment.   Please start patient on Plavix 75 mg daily today. Continue aspirin 81 mg daily. Ok for note out of work through Wednesday, April 05, 2015.

## 2015-03-31 NOTE — Telephone Encounter (Signed)
Pt wife calling stating that pt is started developing rashes all over, started Wednesday It went away and then came back Went to PCP and they took pt off blood thinner. Pt states this morning the rashes are a bit down but now he is having swelling in hands  +2 edema left hand +1 edema  They were told to be off it 2 w and would like to know if this is okay and what else can be doing She also states he had some dizzy ness Wednesday night. PCP said it has to be because of the blood thinner.  Today he is feeling a bit weak, and he would like another work note for he is suppose to go back Monday but thinks he needs another few days off Please advise.

## 2015-04-04 ENCOUNTER — Telehealth: Payer: Self-pay

## 2015-04-04 NOTE — Telephone Encounter (Signed)
Received records request [employer], [facility]

## 2015-04-18 ENCOUNTER — Telehealth: Payer: Self-pay

## 2015-04-18 NOTE — Telephone Encounter (Signed)
2nd request Received records request [employer], [facility]

## 2015-05-19 ENCOUNTER — Encounter: Payer: Self-pay | Admitting: Cardiovascular Disease

## 2015-05-19 ENCOUNTER — Ambulatory Visit (INDEPENDENT_AMBULATORY_CARE_PROVIDER_SITE_OTHER): Payer: 59 | Admitting: Cardiovascular Disease

## 2015-05-19 VITALS — BP 92/62 | HR 52 | Ht 66.0 in | Wt 168.5 lb

## 2015-05-19 DIAGNOSIS — I2510 Atherosclerotic heart disease of native coronary artery without angina pectoris: Secondary | ICD-10-CM | POA: Diagnosis not present

## 2015-05-19 DIAGNOSIS — E785 Hyperlipidemia, unspecified: Secondary | ICD-10-CM | POA: Diagnosis not present

## 2015-05-19 DIAGNOSIS — I255 Ischemic cardiomyopathy: Secondary | ICD-10-CM

## 2015-05-19 NOTE — Assessment & Plan Note (Signed)
He is doing very well at the present time with no symptoms suggestive of angina. Continue medical therapy. The plan is to treat him with dual antiplatelet therapy for a minimum of one year.

## 2015-05-19 NOTE — Assessment & Plan Note (Signed)
Continue low-dose metoprolol and lisinopril. Continue to monitor blood pressure and heart rate closely. Both are low but he is asymptomatic.

## 2015-05-19 NOTE — Progress Notes (Signed)
HPI  54 y/o male who is here today for a follow-up visit regarding coronary artery disease and stenting of the LAD in September 2016.he was admitted at that time with non-ST elevation myocardial infarction with borderline anterior ST elevation. Cardiac catheterization revealed 99% mid LAD stenosis and 60% mid left circumflex stenosis. Ejection fraction was 45-50% with distal anterior wall and apical hypokinesis. He underwent successful angioplasty and 2 overlapped drug-eluting stent placement to the mid LAD. A second stent was placed for suspected distal edge dissection.   He developed a rash which was thought to be due to Effient. This was switched to Plavix with significant improvement. He denies any recurrent chest pain or shortness of breath. His blood pressure continues to be on the low side but he denies any dizziness. He was not able to attend cardiac rehabilitation and resumed working. He had recent labs done which showed a total cholesterol of 122, LDL of 65, HDL of 45 and triglyceride of 60. Hemoglobin A1c was 6.9.    Allergies  Allergen Reactions  . Effient [Prasugrel]     Rash     Current Outpatient Prescriptions on File Prior to Visit  Medication Sig Dispense Refill  . aspirin 81 MG tablet Take 81 mg by mouth daily.    Marland Kitchen. atorvastatin (LIPITOR) 80 MG tablet Take 1 tablet (80 mg total) by mouth daily at 6 PM. 90 tablet 0  . clopidogrel (PLAVIX) 75 MG tablet Take 1 tablet (75 mg total) by mouth daily. 30 tablet 11  . lisinopril (PRINIVIL,ZESTRIL) 5 MG tablet Take 5 mg by mouth daily.    . metFORMIN (GLUCOPHAGE-XR) 500 MG 24 hr tablet Take 2,000 mg by mouth daily after supper.    . metoprolol tartrate (LOPRESSOR) 25 MG tablet Take 0.5 tablets (12.5 mg total) by mouth 2 (two) times daily. (Patient taking differently: Take 12.5 mg by mouth daily. ) 30 tablet 3  . nitroGLYCERIN (NITROSTAT) 0.4 MG SL tablet Place 1 tablet (0.4 mg total) under the tongue every 5 (five) minutes as needed  for chest pain. 25 tablet 3  . pioglitazone (ACTOS) 45 MG tablet Take 45 mg by mouth daily.    . saxagliptin HCl (ONGLYZA) 5 MG TABS tablet Take 5 mg by mouth daily.     No current facility-administered medications on file prior to visit.     Past Medical History  Diagnosis Date  . Diabetes mellitus without complication (HCC)   . Essential hypertension   . Hyperlipemia   . CAD (coronary artery disease)     a. 02/2015 NSTEMI/PCI: LM nl, LAD 2629m (2.75x23 Xience/2.5x8 Xience DES), D2 20ost, LCX 4820m, RCA 7763m, RPDA1/2 40, EF 45-50.  . Ischemic cardiomyopathy     a. 02/2015 Echo: Ef 45-50%, mild apicalanterior HK, sev apical HK, triv MR.     Past Surgical History  Procedure Laterality Date  . Cardiac catheterization N/A 03/06/2015    Procedure: Left Heart Cath and Coronary Angiography;  Surgeon: Iran OuchMuhammad A Nezzie Manera, MD;  Location: [facility] INVASIVE CV LAB;  Service: Cardiovascular;  Laterality: N/A;  . Cardiac catheterization N/A 03/06/2015    Procedure: Coronary Stent Intervention;  Surgeon: Iran OuchMuhammad A Raseel Jans, MD;  Location: [facility] INVASIVE CV LAB;  Service: Cardiovascular;  Laterality: N/A;  . Arm surgery      left     Family History  Problem Relation Age of Onset  . Diabetes Other   . Heart attack Father      Social History   Social History  .  Marital Status: Married    Spouse Name: N/A  . Number of Children: N/A  . Years of Education: N/A   Occupational History  . Not on file.   Social History Main Topics  . Smoking status: Never Smoker   . Smokeless tobacco: Not on file  . Alcohol Use: No  . Drug Use: No  . Sexual Activity: Not on file   Other Topics Concern  . Not on file   Social History Narrative     PHYSICAL EXAM   BP 92/62 mmHg  Pulse 52  Ht  (1.676 m)  Wt 168 lb 8 oz (76.431 kg)  BMI 27.21 kg/m2  Constitutional: He is oriented to person, place, and time. He appears well-developed and well-nourished. No distress.  HENT: No nasal discharge.  Head:  Normocephalic and atraumatic.  Eyes: Pupils are equal and round.  No discharge. Neck: Normal range of motion. Neck supple. No JVD present. No thyromegaly present.  Cardiovascular: Normal rate, regular rhythm, normal heart sounds. Exam reveals no gallop and no friction rub. No murmur heard.  Pulmonary/Chest: Effort normal and breath sounds normal. No stridor. No respiratory distress. He has no wheezes. He has no rales. He exhibits no tenderness.  Abdominal: Soft. Bowel sounds are normal. He exhibits no distension. There is no tenderness. There is no rebound and no guarding.  Musculoskeletal: Normal range of motion. He exhibits no edema and no tenderness.  Neurological: He is alert and oriented to person, place, and time. Coordination normal.  Skin: Skin is warm and dry. No rash noted. He is not diaphoretic. No erythema. No pallor.  Psychiatric: He has a normal mood and affect. His behavior is normal. Judgment and thought content normal.      ASSESSMENT AND PLAN

## 2015-05-19 NOTE — Assessment & Plan Note (Signed)
His recent lipid profile was optimal. I requested liver profile given that he is on a high-dose statin.

## 2015-05-19 NOTE — Patient Instructions (Signed)
Medication Instructions:  Your physician recommends that you continue on your current medications as directed. Please refer to the Current Medication list given to you today.   Labwork: Liver profile  Testing/Procedures: none  Follow-Up: Your physician recommends that you schedule a follow-up appointment in: 3 months with Dr. Kirke CorinArida   Any Other Special Instructions Will Be Listed Below (If Applicable).     If you need a refill on your cardiac medications before your next appointment, please call your pharmacy.

## 2015-05-20 LAB — HEPATIC FUNCTION PANEL
ALBUMIN: 4.3 g/dL (ref 3.5–5.5)
ALT: 16 IU/L (ref 0–44)
AST: 17 IU/L (ref 0–40)
Alkaline Phosphatase: 72 IU/L (ref 39–117)
BILIRUBIN TOTAL: 0.4 mg/dL (ref 0.0–1.2)
BILIRUBIN, DIRECT: 0.12 mg/dL (ref 0.00–0.40)
TOTAL PROTEIN: 6.7 g/dL (ref 6.0–8.5)

## 2015-08-18 ENCOUNTER — Encounter: Payer: Self-pay | Admitting: Cardiovascular Disease

## 2015-08-18 ENCOUNTER — Ambulatory Visit (INDEPENDENT_AMBULATORY_CARE_PROVIDER_SITE_OTHER): Payer: 59 | Admitting: Cardiovascular Disease

## 2015-08-18 VITALS — BP 112/68 | HR 66 | Ht 66.0 in | Wt 170.8 lb

## 2015-08-18 DIAGNOSIS — E785 Hyperlipidemia, unspecified: Secondary | ICD-10-CM | POA: Diagnosis not present

## 2015-08-18 DIAGNOSIS — I2510 Atherosclerotic heart disease of native coronary artery without angina pectoris: Secondary | ICD-10-CM | POA: Diagnosis not present

## 2015-08-18 NOTE — Progress Notes (Signed)
Cardiology Office Note   Date:  08/18/2015   ID:  Jeff Howard, DOB 09/22/1960, MRN [id number]  PCP:  Nonda LouShapely-Quinn, Todd [place], MD  Cardiologist:   Lorine BearsMuhammad Arida, MD   Chief Complaint  Patient presents with  . other    3 month follow up. Meds reviewed by the patient verbally. "doing well."       History of Present Illness: Jeff Howard is a 55 y.o. male who presents for a follow-up visit regarding coronary artery disease and stenting of the LAD in September 2016. He was admitted at that time with non-ST elevation myocardial infarction with borderline anterior ST elevation. Cardiac catheterization revealed 99% mid LAD stenosis and 60% mid left circumflex stenosis. Ejection fraction was 45-50% with distal anterior wall and apical hypokinesis. He underwent successful angioplasty and 2 overlapped drug-eluting stent placement to the mid LAD. A second stent was placed for suspected distal edge dissection.   He developed a rash which was thought to be due to Effient. This was switched to Plavix with significant improvement.  He has been doing extremely well and denies any chest pain or shortness of breath. No palpitations. He reports no side effects with medications. His diabetes has been reasonably controlled with hemoglobin A1c of less than 7.   Past Medical History  Diagnosis Date  . Diabetes mellitus without complication (HCC)   . Essential hypertension   . Hyperlipemia   . CAD (coronary artery disease)     a. 02/2015 NSTEMI/PCI: LM nl, LAD 7268m (2.75x23 Xience/2.5x8 Xience DES), D2 20ost, LCX 4028m, RCA 7267m, RPDA1/2 40, EF 45-50.  . Ischemic cardiomyopathy     a. 02/2015 Echo: Ef 45-50%, mild apicalanterior HK, sev apical HK, triv MR.    Past Surgical History  Procedure Laterality Date  . Cardiac catheterization N/A 03/06/2015    Procedure: Left Heart Cath and Coronary Angiography;  Surgeon: Iran OuchMuhammad A Arida, MD;  Location: [facility] INVASIVE CV LAB;  Service:  Cardiovascular;  Laterality: N/A;  . Cardiac catheterization N/A 03/06/2015    Procedure: Coronary Stent Intervention;  Surgeon: Iran OuchMuhammad A Arida, MD;  Location: [facility] INVASIVE CV LAB;  Service: Cardiovascular;  Laterality: N/A;  . Arm surgery      left     Current Outpatient Prescriptions  Medication Sig Dispense Refill  . aspirin 81 MG tablet Take 81 mg by mouth daily.    Marland Kitchen. atorvastatin (LIPITOR) 80 MG tablet Take 1 tablet (80 mg total) by mouth daily at 6 PM. 90 tablet 0  . clopidogrel (PLAVIX) 75 MG tablet Take 1 tablet (75 mg total) by mouth daily. 30 tablet 11  . lisinopril (PRINIVIL,ZESTRIL) 5 MG tablet Take 5 mg by mouth daily.    . metFORMIN (GLUCOPHAGE-XR) 500 MG 24 hr tablet Take 2,000 mg by mouth daily after supper.    . metoprolol tartrate (LOPRESSOR) 25 MG tablet Take 0.5 tablets (12.5 mg total) by mouth 2 (two) times daily. (Patient taking differently: Take 12.5 mg by mouth daily. ) 30 tablet 3  . nitroGLYCERIN (NITROSTAT) 0.4 MG SL tablet Place 1 tablet (0.4 mg total) under the tongue every 5 (five) minutes as needed for chest pain. 25 tablet 3  . pioglitazone (ACTOS) 45 MG tablet Take 45 mg by mouth daily.    . saxagliptin HCl (ONGLYZA) 5 MG TABS tablet Take 5 mg by mouth daily.     No current facility-administered medications for this visit.    Allergies:   Effient    Social History:  The patient  reports that he has never smoked. He does not have any smokeless tobacco history on file. He reports that he does not drink alcohol or use illicit drugs.   Family History:  The patient's family history includes Diabetes in his other; Heart attack in his father.    ROS:  Please see the history of present illness.   Otherwise, review of systems are positive for none.   All other systems are reviewed and negative.    PHYSICAL EXAM: VS:  BP 112/68 mmHg  Pulse 66  Ht  (1.676 m)  Wt 170 lb 12 oz (77.452 kg)  BMI 27.57 kg/m2  SpO2 99% , BMI Body mass index is 27.57  kg/(m^2). GEN: Well nourished, well developed, in no acute distress HEENT: normal Neck: no JVD, carotid bruits, or masses Cardiac: RRR; no murmurs, rubs, or gallops,no edema  Respiratory:  clear to auscultation bilaterally, normal work of breathing GI: soft, nontender, nondistended, + BS MS: no deformity or atrophy Skin: warm and dry, no rash Neuro:  Strength and sensation are intact Psych: euthymic mood, full affect   EKG:  EKG is not ordered today.    Recent Labs: 03/07/2015: BUN 13; Creatinine, Ser 0.81; Hemoglobin 14.4; Platelets 176; Potassium 4.3; Sodium 138 05/19/2015: ALT 16    Lipid Panel    Component Value Date/Time   CHOL 140 03/06/2015 0011   TRIG 60 03/06/2015 0011   HDL 52 03/06/2015 0011   CHOLHDL 2.7 03/06/2015 0011   VLDL 12 03/06/2015 0011   LDLCALC 76 03/06/2015 0011      Wt Readings from Last 3 Encounters:  08/18/15 170 lb 12 oz (77.452 kg)  05/19/15 168 lb 8 oz (76.431 kg)  03/24/15 165 lb (74.844 kg)        ASSESSMENT AND PLAN:  1.  Coronary artery disease involving native coronary arteries without angina: He is doing extremely well with no symptoms suggestive of angina. Continue medical therapy. Continue dual antiplatelet therapy at least until September 2017 but I will most likely extended the duration to 2 years given the presence of 2 overlapped stents in the LAD.  2. Hyperlipidemia: LDL was 76 which is close to target. His lipid profile was normal. Continue high dose atorvastatin.  3. Ischemic cardiomyopathy: Ejection fraction was 45-50%. He has no symptoms suggestive of volume overload. Continue small dose metoprolol and lisinopril.      Disposition:   FU with me in 6 months  Signed,  [name], MD  08/18/2015 10:38 AM    [facility]

## 2015-08-18 NOTE — Patient Instructions (Signed)
Medication Instructions: Continue same medications.   Labwork: None.   Procedures/Testing: None.   Follow-Up: 6 months follow up with Dr. Kortlyn Koltz.   Any Additional Special Instructions Will Be Listed Below (If Applicable).     If you need a refill on your cardiac medications before your next appointment, please call your pharmacy.   

## 2015-11-20 ENCOUNTER — Other Ambulatory Visit: Payer: Self-pay | Admitting: Nurse Practitioner

## 2015-11-20 ENCOUNTER — Other Ambulatory Visit: Payer: Self-pay

## 2015-11-20 MED ORDER — METOPROLOL TARTRATE 25 MG PO TABS: 12.5000 mg | ORAL_TABLET | Freq: Two times a day (BID) | ORAL | Status: DC

## 2015-11-21 ENCOUNTER — Other Ambulatory Visit: Payer: Self-pay | Admitting: Nurse Practitioner

## 2015-12-22 ENCOUNTER — Emergency Department
Admission: EM | Admit: 2015-12-22 | Discharge: 2015-12-22 | Disposition: A | Discharge status: Home / Self Care | Payer: 59 | Attending: Emergency Medicine | Admitting: Emergency Medicine

## 2015-12-22 DIAGNOSIS — T782XXA Anaphylactic shock, unspecified, initial encounter: Secondary | ICD-10-CM | POA: Insufficient documentation

## 2015-12-22 DIAGNOSIS — Z7982 Long term (current) use of aspirin: Secondary | ICD-10-CM | POA: Diagnosis not present

## 2015-12-22 DIAGNOSIS — E119 Type 2 diabetes mellitus without complications: Secondary | ICD-10-CM | POA: Diagnosis not present

## 2015-12-22 DIAGNOSIS — I2510 Atherosclerotic heart disease of native coronary artery without angina pectoris: Secondary | ICD-10-CM | POA: Diagnosis not present

## 2015-12-22 DIAGNOSIS — T63441A Toxic effect of venom of bees, accidental (unintentional), initial encounter: Secondary | ICD-10-CM | POA: Diagnosis not present

## 2015-12-22 DIAGNOSIS — T7840XA Allergy, unspecified, initial encounter: Secondary | ICD-10-CM | POA: Diagnosis present

## 2015-12-22 DIAGNOSIS — I10 Essential (primary) hypertension: Secondary | ICD-10-CM | POA: Insufficient documentation

## 2015-12-22 DIAGNOSIS — Z7984 Long term (current) use of oral hypoglycemic drugs: Secondary | ICD-10-CM | POA: Insufficient documentation

## 2015-12-22 DIAGNOSIS — E785 Hyperlipidemia, unspecified: Secondary | ICD-10-CM | POA: Insufficient documentation

## 2015-12-22 MED ORDER — ONDANSETRON HCL 4 MG/2ML IJ SOLN
4.0000 mg | Freq: Once | INTRAMUSCULAR | Status: AC
  Administered 2015-12-22: 4 mg via INTRAVENOUS

## 2015-12-22 MED ORDER — ONDANSETRON 4 MG PO TBDP: 4.0000 mg | ORAL_TABLET | Freq: Three times a day (TID) | ORAL | Status: DC | PRN

## 2015-12-22 MED ORDER — DIPHENHYDRAMINE HCL 50 MG/ML IJ SOLN
INTRAMUSCULAR | Status: AC
  Filled 2015-12-22: qty 1

## 2015-12-22 MED ORDER — FAMOTIDINE IN NACL 20-0.9 MG/50ML-% IV SOLN
INTRAVENOUS | Status: AC
  Administered 2015-12-22: 20 mg via INTRAVENOUS
  Filled 2015-12-22: qty 50

## 2015-12-22 MED ORDER — EPINEPHRINE HCL 1 MG/ML IJ SOLN
0.3000 mg | Freq: Once | INTRAMUSCULAR | Status: DC
Start: 2015-12-22 — End: 2015-12-22

## 2015-12-22 MED ORDER — FAMOTIDINE IN NACL 20-0.9 MG/50ML-% IV SOLN
20.0000 mg | Freq: Once | INTRAVENOUS | Status: AC
  Administered 2015-12-22: 20 mg via INTRAVENOUS

## 2015-12-22 MED ORDER — EPINEPHRINE 0.3 MG/0.3ML IJ SOAJ: 0.3000 mg | Freq: Once | INTRAMUSCULAR | Status: DC

## 2015-12-22 MED ORDER — EPINEPHRINE HCL 1 MG/ML IJ SOLN
INTRAMUSCULAR | Status: AC
  Filled 2015-12-22: qty 1

## 2015-12-22 MED ORDER — EPINEPHRINE HCL 0.1 MG/ML IJ SOSY: 0.3000 mg | PREFILLED_SYRINGE | Freq: Once | INTRAMUSCULAR | Status: DC

## 2015-12-22 MED ORDER — METHYLPREDNISOLONE SODIUM SUCC 125 MG IJ SOLR
INTRAMUSCULAR | Status: AC
  Administered 2015-12-22: 125 mg via INTRAVENOUS
  Filled 2015-12-22: qty 2

## 2015-12-22 MED ORDER — DIPHENHYDRAMINE HCL 25 MG PO TABS: 25.0000 mg | ORAL_TABLET | Freq: Four times a day (QID) | ORAL | Status: DC | PRN

## 2015-12-22 MED ORDER — PREDNISONE 20 MG PO TABS: 40.0000 mg | ORAL_TABLET | Freq: Every day | ORAL | Status: DC

## 2015-12-22 MED ORDER — ONDANSETRON HCL 4 MG/2ML IJ SOLN
INTRAMUSCULAR | Status: AC
Start: 2015-12-22 — End: 2015-12-22
  Administered 2015-12-22: 22:00:00
  Filled 2015-12-22: qty 2

## 2015-12-22 MED ORDER — METHYLPREDNISOLONE SODIUM SUCC 125 MG IJ SOLR
125.0000 mg | Freq: Once | INTRAMUSCULAR | Status: AC
  Administered 2015-12-22: 125 mg via INTRAVENOUS

## 2015-12-22 MED ORDER — EPINEPHRINE HCL 1 MG/ML IJ SOLN
0.3000 mg | Freq: Once | INTRAMUSCULAR | Status: AC
  Administered 2015-12-22: 0.3 mg via INTRAMUSCULAR

## 2015-12-22 NOTE — Discharge Instructions (Signed)
Anaphylactic Reaction °An anaphylactic reaction is a sudden, severe allergic reaction that involves the whole body. It can be life threatening. A hospital stay is often required. People with asthma, eczema, or hay fever are slightly more likely to have an anaphylactic reaction. °CAUSES  °An anaphylactic reaction may be caused by anything to which you are allergic. After being exposed to the allergic substance, your immune system becomes sensitized to it. When you are exposed to that allergic substance again, an allergic reaction can occur. Common causes of an anaphylactic reaction include: °· Medicines. °· Foods, especially peanuts, wheat, shellfish, milk, and eggs. °· Insect bites or stings. °· Blood products. °· Chemicals, such as dyes, latex, and contrast material used for imaging tests. °SYMPTOMS  °When an allergic reaction occurs, the body releases histamine and other substances. These substances cause symptoms such as tightening of the airway. Symptoms often develop within seconds or minutes of exposure. Symptoms may include: °· Skin rash or hives. °· Itching. °· Chest tightness. °· Swelling of the eyes, tongue, or lips. °· Trouble breathing or swallowing. °· Lightheadedness or fainting. °· Anxiety or confusion. °· Stomach pains, vomiting, or diarrhea. °· Nasal congestion. °· A fast or irregular heartbeat (palpitations). °DIAGNOSIS  °Diagnosis is based on your history of recent exposure to allergic substances, your symptoms, and a physical exam. Your caregiver may also perform blood or urine tests to confirm the diagnosis. °TREATMENT  °Epinephrine medicine is the main treatment for an anaphylactic reaction. Other medicines that may be used for treatment include antihistamines, steroids, and albuterol. In severe cases, fluids and medicine to support blood pressure may be given through an intravenous line (IV). Even if you improve after treatment, you need to be observed to make sure your condition does not get  worse. This may require a stay in the hospital. °HOME CARE INSTRUCTIONS  °· Wear a medical alert bracelet or necklace stating your allergy. °· You and your family must learn how to use an anaphylaxis kit or give an epinephrine injection to temporarily treat an emergency allergic reaction. Always carry your epinephrine injection or anaphylaxis kit with you. This can be lifesaving if you have a severe reaction. °· Do not drive or perform tasks after treatment until the medicines used to treat your reaction have worn off, or until your caregiver says it is okay. °· If you have hives or a rash: °· Take medicines as directed by your caregiver. °· You may use an over-the-counter antihistamine (diphenhydramine) as needed. °· Apply cold compresses to the skin or take baths in cool water. Avoid hot baths or showers. °SEEK MEDICAL CARE IF:  °· You develop symptoms of an allergic reaction to a new substance. Symptoms may start right away or minutes later. °· You develop a rash, hives, or itching. °· You develop new symptoms. °SEEK IMMEDIATE MEDICAL CARE IF:  °· You have swelling of the mouth, difficulty breathing, or wheezing. °· You have a tight feeling in your chest or throat. °· You develop hives, swelling, or itching all over your body. °· You develop severe vomiting or diarrhea. °· You feel faint or pass out. °This is an emergency. Use your epinephrine injection or anaphylaxis kit as you have been instructed. Call your local emergency services (911 in [place] Even if you improve after the injection, you need to be examined at a hospital emergency department. °MAKE SURE YOU:  °· Understand these instructions. °· Will watch your condition. °· Will get help right away if you are not   doing well or get worse.   This information is not intended to replace advice given to you by your health care provider. Make sure you discuss any questions you have with your health care provider.   Document Released: 05/27/2005 Document  Revised: 06/01/2013 Document Reviewed: 12/07/2014 Elsevier Interactive Patient Education 2016 Elsevier Inc.  Epinephrine Injection Epinephrine is a medicine given by injection to temporarily treat an emergency allergic reaction. It is also used to treat severe asthmatic attacks and other lung problems. The medicine helps to enlarge (dilate) the small breathing tubes of the lungs. A life-threatening, sudden allergic reaction that involves the whole body is called anaphylaxis. Because of potential side effects, epinephrine should only be used as directed by your caregiver. RISKS AND COMPLICATIONS Possible side effects of epinephrine injections include:  Chest pain.  Irregular or rapid heartbeat.  Shortness of breath.  Nausea.  Vomiting.  Abdominal pain or cramping.  Sweating.  Dizziness.  Weakness.  Headache.  Nervousness. Report all side effects to your caregiver. HOW TO GIVE AN EPINEPHRINE INJECTION Give the epinephrine injection immediately when symptoms of a severe reaction begin. Inject the medicine into the outer thigh or any available, large muscle. Your caregiver can teach you how to do this. You do not need to remove any clothing. After the injection, call your local emergency services (911 in [place] Even if you improve after the injection, you need to be examined at a hospital emergency department. Epinephrine works quickly, but it also wears off quickly. Delayed reactions can occur. A delayed reaction may be as serious and dangerous as the initial reaction. HOME CARE INSTRUCTIONS  Make sure you and your family know how to give an epinephrine injection.  Use epinephrine injections as directed by your caregiver. Do not use this medicine more often or in larger doses than prescribed.  Always carry your epinephrine injection or anaphylaxis kit with you. This can be lifesaving if you have a severe reaction.  Store the medicine in a cool, dry place. If the medicine becomes  discolored or cloudy, dispose of it properly and replace it with new medicine.  Check the expiration date on your medicine. It may be unsafe to use medicines past their expiration date.  Tell your caregiver about any other medicines you are taking. Some medicines can react badly with epinephrine.  Tell your caregiver about any medical conditions you have, such as diabetes, high blood pressure (hypertension), heart disease, irregular heartbeats, or if you are pregnant. SEEK IMMEDIATE MEDICAL CARE IF:  You have used an epinephrine injection. Call your local emergency services (911 in [place] Even if you improve after the injection, you need to be examined at a hospital emergency department to make sure your allergic reaction is under control. You will also be monitored for adverse effects from the medicine.  You have chest pain.  You have irregular or fast heartbeats.  You have shortness of breath.  You have severe headaches.  You have severe nausea, vomiting, or abdominal cramps.  You have severe pain, swelling, or redness in the area where you gave the injection.   This information is not intended to replace advice given to you by your health care provider. Make sure you discuss any questions you have with your health care provider.   Document Released: 05/24/2000 Document Revised: 08/19/2011 Document Reviewed: 12/14/2014 Elsevier Interactive Patient Education 2016 [employer].  [place], [place], or [employer], wasps, and hornets are part of a family of insects that can sting people.  These stings can cause pain and inflammation, but they are usually not serious. However, some people may have an allergic reaction to a sting. This can cause the symptoms to be more severe.  SYMPTOMS  Common symptoms of this condition include:   A red lump in the skin that sometimes has a tiny hole in the center. In some cases, a stinger may be in the center of the wound.  Pain and itching at the  sting site.  Redness and swelling around the sting site. If you have an allergic reaction (localized allergic reaction), the swelling and redness may spread out from the sting site. In some cases, this reaction can continue to develop over the next 12-36 hours. In rare cases, a person may have a severe allergic reaction (anaphylactic reaction) to a sting. Symptoms of an anaphylactic reaction may include:   Wheezing or difficulty breathing.  Raised, itchy, red patches on the skin.  Nausea or vomiting.  Abdominal cramping.  Diarrhea.  Chest pain.  Fainting.  Redness of the face (flushing). DIAGNOSIS  This condition is usually diagnosed based on symptoms, medical history, and a physical exam. TREATMENT  Most stings can be treated with:   Icing to reduce swelling.  Medicines (antihistamines) to treat itching or an allergic reaction.  Medicines to help reduce pain. These may be medicines that you take by mouth, or medicated creams or lotions that you apply to your skin. If you were stung by a bee, the stinger and a small sac of poison may be in the wound. This may be removed by brushing across it with a flat card, such as a credit card. Another method is to pinch the area and pull it out. These methods can help reduce the severity of the body's reaction to the sting.  HOME CARE INSTRUCTIONS   Wash the sting site daily with soap and water as told by your health care provider.  Apply or take over-the-counter and prescription medicines only as told by your health care provider.  If directed, apply ice to the sting area.  Put ice in a plastic bag.  Place a towel between your skin and the bag.  Leave the ice on for 20 minutes, 2-3 times per day.  Do not scratch the sting area.  To lessen pain, try using a paste that is made of water and baking soda. Rub the paste on the sting area and leave it on for 5 minutes.  If you had a severe allergic reaction to a sting, you may  need:  To wear a medical bracelet or necklace that lists the allergy.  To learn when and how to use an anaphylaxis kit or epinephrine injection. Your family members may also need to learn this.  To carry an anaphylaxis kit with you at all times. SEEK MEDICAL CARE IF:   Your symptoms do not get better in 2-3 days.  You have redness, swelling, or pain that spreads beyond the area of the sting.  You have a fever. SEEK IMMEDIATE MEDICAL CARE IF:  You have symptoms of a severe allergic reaction. These include:   Wheezing or difficulty breathing.  Chest pain.  Light-headedness or fainting.  Itchy, raised, red patches on the skin.  Nausea or vomiting.  Abdominal cramping.  Diarrhea.   This information is not intended to replace advice given to you by your health care provider. Make sure you discuss any questions you have with your health care provider.   Document Released: 05/27/2005 Document  Revised: 02/15/2015 Document Reviewed: 10/12/2014 Elsevier Interactive Patient Education [employer].

## 2015-12-22 NOTE — ED Notes (Signed)
Patient vomiting at this time. Large amount of emesis noted in the floor at bedside. MD made aware. VORB for Zofran 4mg  IVP. Order to be entered and carried by this RN.

## 2015-12-22 NOTE — ED Provider Notes (Signed)
[facility] Emergency Department Provider Note  ____________________________________________  Time seen: 9:15 PM  I have reviewed the triage vital signs and the nursing notes.   HISTORY  Chief Complaint Allergic Reaction    HPI Jeff Howard is a 55 y.o. male brought to ED via EMS due to diffuse urticarial rash. He reports that around 5 PM today he was stung by 15 or 20 bees while mowing his lawn. He took 50 mg of Benadryl at approximately 7 PM. He's had ongoing symptoms. Denies shortness of breath throat swelling or vomiting, however on arrival to the ED treatment room he began vomiting multiple times. No known allergies.     Past Medical History  Diagnosis Date  . Diabetes mellitus without complication (HCC)   . Essential hypertension   . Hyperlipemia   . CAD (coronary artery disease)     a. 02/2015 NSTEMI/PCI: LM nl, LAD 15m (2.75x23 Xience/2.5x8 Xience DES), D2 20ost, LCX 51m, RCA 68m, RPDA1/2 40, EF 45-50.  . Ischemic cardiomyopathy     a. 02/2015 Echo: Ef 45-50%, mild apicalanterior HK, sev apical HK, triv MR.     Patient Active Problem List   Diagnosis Date Noted  . CAD (coronary artery disease)   . Hyperlipemia   . Hypertension   . Diabetes mellitus without complication (HCC)   . Ischemic cardiomyopathy   . Pain in the chest   . Angina pectoris (HCC)   . Poorly controlled type 2 diabetes mellitus with circulatory disorder (HCC)   . Hyperlipidemia   . NSTEMI (non-ST elevated myocardial infarction) (HCC) 03/05/2015     Past Surgical History  Procedure Laterality Date  . Cardiac catheterization N/A 03/06/2015    Procedure: Left Heart Cath and Coronary Angiography;  Surgeon: Iran Ouch, MD;  Location: [facility] INVASIVE CV LAB;  Service: Cardiovascular;  Laterality: N/A;  . Cardiac catheterization N/A 03/06/2015    Procedure: Coronary Stent Intervention;  Surgeon: Iran Ouch, MD;  Location: [facility] INVASIVE CV LAB;  Service:  Cardiovascular;  Laterality: N/A;  . Arm surgery      left     Current Outpatient Rx  Name  Route  Sig  Dispense  Refill  . aspirin 81 MG tablet   Oral   Take 81 mg by mouth daily.         Marland Kitchen atorvastatin (LIPITOR) 80 MG tablet   Oral   Take 1 tablet (80 mg total) by mouth daily at 6 PM.   90 tablet   0   . clopidogrel (PLAVIX) 75 MG tablet   Oral   Take 1 tablet (75 mg total) by mouth daily.   30 tablet   11   . lisinopril (PRINIVIL,ZESTRIL) 5 MG tablet   Oral   Take 5 mg by mouth daily.         . metFORMIN (GLUCOPHAGE-XR) 500 MG 24 hr tablet   Oral   Take 2,000 mg by mouth daily after supper.         . metoprolol tartrate (LOPRESSOR) 25 MG tablet   Oral   Take 0.5 tablets (12.5 mg total) by mouth 2 (two) times daily.   30 tablet   3   . nitroGLYCERIN (NITROSTAT) 0.4 MG SL tablet   Sublingual   Place 1 tablet (0.4 mg total) under the tongue every 5 (five) minutes as needed for chest pain.   25 tablet   3   . pioglitazone (ACTOS) 45 MG tablet   Oral  Take 45 mg by mouth daily.         . saxagliptin HCl (ONGLYZA) 5 MG TABS tablet   Oral   Take 5 mg by mouth daily.            Allergies Effient   Family History  Problem Relation Age of Onset  . Diabetes Other   . Heart attack Father     Social History Social History  Substance Use Topics  . Smoking status: Never Smoker   . Smokeless tobacco: None  . Alcohol Use: No    Review of Systems  Constitutional:   No fever or chills.  ENT:   No sore throat. No rhinorrhea. Cardiovascular:   No chest pain. Respiratory:   No dyspnea or cough. Gastrointestinal:   Negative for abdominal pain, vomiting and diarrhea.  Genitourinary:   Negative for dysuria or difficulty urinating. Musculoskeletal:   Negative for focal pain or swelling Neurological:   Negative for headaches. No dizziness or syncope 10-point ROS otherwise negative.  ____________________________________________   PHYSICAL  EXAM:  VITAL SIGNS: ED Triage Vitals  Enc Vitals Group     BP 12/22/15 2113 125/85 mmHg     Pulse Rate 12/22/15 2113 76     Resp 12/22/15 2113 18     Temp 12/22/15 2113 97.8 F (36.6 C)     Temp Source 12/22/15 2113 Oral     SpO2 12/22/15 2113 98 %     Weight --      Height --      Head Cir --      Peak Flow --      Pain Score 12/22/15 2109 6     Pain Loc --      Pain Edu? --      Excl. in GC? --     Vital signs reviewed, nursing assessments reviewed.   Constitutional:   Alert and oriented. No distress. Eyes:   No scleral icterus. No conjunctival pallor. PERRL. EOMI.  No nystagmus. ENT   Head:   Normocephalic and atraumatic.   Nose:   No congestion/rhinnorhea. No septal hematoma   Mouth/Throat:   MMM, no pharyngeal erythema. No peritonsillar mass. No uvula edema or tonsillar swelling   Neck:   No stridor. No SubQ emphysema. No meningismus. Hematological/Lymphatic/Immunilogical:   No cervical lymphadenopathy. Cardiovascular:   RRR. Symmetric bilateral radial and DP pulses.  No murmurs.  Respiratory:   Normal respiratory effort without tachypnea nor retractions. Breath sounds are clear and equal bilaterally. No wheezes/rales/rhonchi. Gastrointestinal:   Soft and nontender. Non distended. There is no CVA tenderness.  No rebound, rigidity, or guarding. Genitourinary:   deferred Musculoskeletal:   Nontender with normal range of motion in all extremities. No joint effusions.  No lower extremity tenderness.  Diffuse nonpitting edema of the soft tissues Neurologic:   Normal speech and language.  CN 2-10 normal. Motor grossly intact. No gross focal neurologic deficits are appreciated.  Skin:    Skin is warm, dry and intact. Generalized urticarial rash over the entire body. No bullae or petechia.  ____________________________________________    LABS (pertinent positives/negatives) (all labs ordered are listed, but only abnormal results are displayed) Labs Reviewed  - No data to display ____________________________________________   EKG    ____________________________________________    RADIOLOGY    ____________________________________________   PROCEDURES  CRITICAL CARE Performed by: Scotty CourtSTAFFORD, Julianah Marciel   Total critical care time: 35 minutes  Critical care time was exclusive of separately billable procedures and treating other patients.  Critical care was necessary to treat or prevent imminent or life-threatening deterioration.  Critical care was time spent personally by me on the following activities: development of treatment plan with patient and/or surrogate as well as nursing, discussions with consultants, evaluation of patient's response to treatment, examination of patient, obtaining history from patient or surrogate, ordering and performing treatments and interventions, ordering and review of laboratory studies, ordering and review of radiographic studies, pulse oximetry and re-evaluation of patient's condition.  ____________________________________________   INITIAL IMPRESSION / ASSESSMENT AND PLAN / ED COURSE  Pertinent labs & imaging results that were available during my care of the patient were reviewed by me and considered in my medical decision making (see chart for details).  Patient presents with a generalized urticarial rash after a severe bee sting exposure. With vomiting and anasarca-like appearance on exam, and significant exposure to a likely antigen, this qualifies as anaphylaxis. Patient was artery given 50 mg Benadryl recently. We will give IV famotidine Solu-Medrol and intramuscular epinephrine. We'll monitor in the emergency room for response. Vital signs are normal at this time.   ----------------------------------------- 11:10 PM on 12/22/2015 -----------------------------------------  Feels better. Soft tissue swelling has improved. Compartments are soft. Rash is still present but less bright. No airway  swelling. Lungs clear to auscultation bilaterally. No noticeable wheezing or coughing with forceful expiration. Patient seems to be improving, vital signs are normal oxygen saturation is 99% on room air and is breathing comfortably. Continue Benadryl steroids. Zofran when necessary. EpiPen prescription provided.    ____________________________________________   FINAL CLINICAL IMPRESSION(S) / ED DIAGNOSES  Final diagnoses:  Anaphylactic reaction, initial encounter       Portions of this note were generated with dragon dictation software. Dictation errors may occur despite best attempts at proofreading.   Sharman Cheek, MD 12/22/15 [phone]

## 2015-12-22 NOTE — ED Notes (Signed)
Pt had episode of large emesis. MD Scotty CourtStafford made aware, sheets changed and floor mopped.

## 2015-12-22 NOTE — ED Notes (Addendum)
Pt bib EMS w/ c/o yellow jacket stings.  Pt sts he was stung by 15-20 bees.  Per EMS, pt took 50 mg Benedryl approx 2 hours ago, given by initial EMS crew.  Pt alert, resp even. Generalized rash all over body.

## 2016-02-15 ENCOUNTER — Encounter: Payer: Self-pay | Admitting: Cardiovascular Disease

## 2016-02-15 ENCOUNTER — Ambulatory Visit (INDEPENDENT_AMBULATORY_CARE_PROVIDER_SITE_OTHER): Payer: 59 | Admitting: Cardiovascular Disease

## 2016-02-15 VITALS — BP 134/66 | HR 49 | Ht 66.0 in | Wt 174.8 lb

## 2016-02-15 DIAGNOSIS — I2510 Atherosclerotic heart disease of native coronary artery without angina pectoris: Secondary | ICD-10-CM

## 2016-02-15 DIAGNOSIS — E785 Hyperlipidemia, unspecified: Secondary | ICD-10-CM | POA: Diagnosis not present

## 2016-02-15 DIAGNOSIS — I10 Essential (primary) hypertension: Secondary | ICD-10-CM | POA: Diagnosis not present

## 2016-02-15 DIAGNOSIS — I255 Ischemic cardiomyopathy: Secondary | ICD-10-CM | POA: Diagnosis not present

## 2016-02-15 NOTE — Patient Instructions (Signed)
Medication Instructions: Continue same medications.   Labwork: None.   Procedures/Testing: None.   Follow-Up: 1 year with Dr. Beth Spackman.   Any Additional Special Instructions Will Be Listed Below (If Applicable).     If you need a refill on your cardiac medications before your next appointment, please call your pharmacy.   

## 2016-02-15 NOTE — Progress Notes (Signed)
Cardiology Office Note   Date:  02/15/2016   ID:  Furman Trentman, DOB [birth year], MRN [id number]  PCP:  [name], MD  Cardiologist:   [name], MD   Chief Complaint  Patient presents with  . Other    6 month f/u no complaints.  Pt takes metoprolol 12.5 only qd due to low HR.  Meds reviewed verbally with pt.       History of Present Illness: Jeff Howard is a 55 y.o. male who presents for a follow-up visit regarding coronary artery disease and stenting of the LAD in September 2016. He presented in September 2016 with non-ST elevation myocardial infarction with borderline anterior ST elevation. Cardiac catheterization revealed 99% mid LAD stenosis and 60% mid left circumflex stenosis. Ejection fraction was 45-50% with distal anterior wall and apical hypokinesis. He underwent successful angioplasty and 2 overlapped drug-eluting stent placement to the mid LAD. A second stent was placed for suspected distal edge dissection.   He developed a rash which was thought to be due to Effient. This was switched to Plavix with resolution.  He has been doing extremely well and denies any chest pain or shortness of breath. No palpitations.  He decreased the dose of metoprolol to 12.5 mg once daily due to bradycardia. He denies any dizziness, syncope or presyncope.  Past Medical History:  Diagnosis Date  . CAD (coronary artery disease)    a. 02/2015 NSTEMI/PCI: LM nl, LAD 86m (2.75x23 Xience/2.5x8 Xience DES), D2 20ost, LCX 86m, RCA 64m, RPDA1/2 40, EF 45-50.  . Diabetes mellitus without complication (HCC)   . Essential hypertension   . Hyperlipemia   . Ischemic cardiomyopathy    a. 02/2015 Echo: Ef 45-50%, mild apicalanterior HK, sev apical HK, triv MR.    Past Surgical History:  Procedure Laterality Date  . arm surgery     left  . CARDIAC CATHETERIZATION N/A 03/06/2015   Procedure: Left Heart Cath and Coronary Angiography;  Surgeon: Iran Ouch, MD;   Location: [facility] INVASIVE CV LAB;  Service: Cardiovascular;  Laterality: N/A;  . CARDIAC CATHETERIZATION N/A 03/06/2015   Procedure: Coronary Stent Intervention;  Surgeon: Iran Ouch, MD;  Location: [facility] INVASIVE CV LAB;  Service: Cardiovascular;  Laterality: N/A;     Current Outpatient Prescriptions  Medication Sig Dispense Refill  . aspirin 81 MG tablet Take 81 mg by mouth daily.    Marland Kitchen atorvastatin (LIPITOR) 80 MG tablet Take 1 tablet (80 mg total) by mouth daily at 6 PM. 90 tablet 0  . clopidogrel (PLAVIX) 75 MG tablet Take 1 tablet (75 mg total) by mouth daily. 30 tablet 11  . diphenhydrAMINE (BENADRYL) 25 MG tablet Take 1 tablet (25 mg total) by mouth every 6 (six) hours as needed. 30 tablet 0  . EPINEPHrine 0.3 mg/0.3 mL IJ SOAJ injection Inject 0.3 mLs (0.3 mg total) into the muscle once. Follow package instructions as needed for severe allergy or anaphylactic reaction. 1 Device 2  . lisinopril (PRINIVIL,ZESTRIL) 5 MG tablet Take 5 mg by mouth daily.    . metFORMIN (GLUCOPHAGE-XR) 500 MG 24 hr tablet Take 2,000 mg by mouth daily after supper.    . metoprolol tartrate (LOPRESSOR) 25 MG tablet Take 0.5 tablets (12.5 mg total) by mouth 2 (two) times daily. 30 tablet 3  . nitroGLYCERIN (NITROSTAT) 0.4 MG SL tablet Place 1 tablet (0.4 mg total) under the tongue every 5 (five) minutes as needed for chest pain. 25 tablet 3  .  ondansetron (ZOFRAN ODT) 4 MG disintegrating tablet Take 1 tablet (4 mg total) by mouth every 8 (eight) hours as needed for nausea or vomiting. 20 tablet 0  . pioglitazone (ACTOS) 45 MG tablet Take 45 mg by mouth daily.    . saxagliptin HCl (ONGLYZA) 5 MG TABS tablet Take 5 mg by mouth daily.     No current facility-administered medications for this visit.     Allergies:   Effient [prasugrel]    Social History:  The patient  reports that he has never smoked. He has never used smokeless tobacco. He reports that he does not drink alcohol or use drugs.   Family  History:  The patient's family history includes Diabetes in his other; Heart attack in his father.    ROS:  Please see the history of present illness.   Otherwise, review of systems are positive for none.   All other systems are reviewed and negative.    PHYSICAL EXAM: VS:  BP 134/66 (BP Location: Left Arm, Patient Position: Sitting, Cuff Size: Normal)   Pulse (!) 49   Ht 5\' 6"  (1.676 m)   Wt 174 lb 12 oz (79.3 kg)   BMI 28.21 kg/m  , BMI Body mass index is 28.21 kg/m. GEN: Well nourished, well developed, in no acute distress  HEENT: normal  Neck: no JVD, carotid bruits, or masses Cardiac: RRR; no murmurs, rubs, or gallops,no edema  Respiratory:  clear to auscultation bilaterally, normal work of breathing GI: soft, nontender, nondistended, + BS MS: no deformity or atrophy  Skin: warm and dry, no rash Neuro:  Strength and sensation are intact Psych: euthymic mood, full affect   EKG:  EKG is ordered today. EKG showed sinus bradycardia with a heart rate of 49 bpm. No significant ST or T wave changes.    Recent Labs: 03/07/2015: BUN 13; Creatinine, Ser 0.81; Hemoglobin 14.4; Platelets 176; Potassium 4.3; Sodium 138 05/19/2015: ALT 16    Lipid Panel    Component Value Date/Time   CHOL 140 03/06/2015 0011   TRIG 60 03/06/2015 0011   HDL 52 03/06/2015 0011   CHOLHDL 2.7 03/06/2015 0011   VLDL 12 03/06/2015 0011   LDLCALC 76 03/06/2015 0011      Wt Readings from Last 3 Encounters:  02/15/16 174 lb 12 oz (79.3 kg)  08/18/15 170 lb 12 oz (77.5 kg)  05/19/15 168 lb 8 oz (76.4 kg)        ASSESSMENT AND PLAN:  1.  Coronary artery disease involving native coronary arteries without angina: He is doing extremely well with no symptoms suggestive of angina. Continue medical therapy.  It has been a year since his myocardial infarction and stent placement. I'm planning to use dual antiplatelet therapy for at least 2 years.  2. Hyperlipidemia: Continue high dose atorvastatin.  Most recent LDL was close to 70.  3. Ischemic cardiomyopathy: Ejection fraction was 45-50%. He has no symptoms suggestive of volume overload or heart failure. Continue small dose metoprolol and lisinopril.   Disposition:   FU with me in 12 months  Signed,  Lorine BearsMuhammad Aamira Bischoff, MD  02/15/2016 8:54 AM    [facility]

## 2016-03-23 ENCOUNTER — Other Ambulatory Visit: Payer: Self-pay | Admitting: Physician Assistant

## 2016-09-16 ENCOUNTER — Other Ambulatory Visit: Payer: Self-pay

## 2016-09-16 ENCOUNTER — Encounter: Payer: Self-pay | Admitting: Cardiovascular Disease

## 2016-09-16 MED ORDER — METOPROLOL TARTRATE 25 MG PO TABS: 12.5000 mg | ORAL_TABLET | Freq: Two times a day (BID) | ORAL | 3 refills | Status: DC

## 2016-09-16 NOTE — Telephone Encounter (Signed)
This encounter was created in error - please disregard.

## 2016-10-13 ENCOUNTER — Other Ambulatory Visit: Payer: Self-pay | Admitting: Cardiovascular Disease

## 2016-10-17 ENCOUNTER — Other Ambulatory Visit: Payer: Self-pay | Admitting: Cardiovascular Disease

## 2016-10-21 ENCOUNTER — Other Ambulatory Visit: Payer: Self-pay

## 2016-10-21 MED ORDER — CLOPIDOGREL BISULFATE 75 MG PO TABS: 75.0000 mg | ORAL_TABLET | Freq: Every day | ORAL | 3 refills | Status: DC

## 2016-10-21 NOTE — Telephone Encounter (Signed)
Refill sent for plavix 75 mg 

## 2016-10-24 ENCOUNTER — Other Ambulatory Visit: Payer: Self-pay | Admitting: *Deleted

## 2016-10-24 MED ORDER — CLOPIDOGREL BISULFATE 75 MG PO TABS: 75.0000 mg | ORAL_TABLET | Freq: Every day | ORAL | 3 refills | Status: DC

## 2016-12-24 ENCOUNTER — Other Ambulatory Visit: Payer: Self-pay

## 2016-12-24 MED ORDER — METOPROLOL TARTRATE 25 MG PO TABS: 12.5000 mg | ORAL_TABLET | Freq: Two times a day (BID) | ORAL | 3 refills | Status: DC

## 2016-12-24 NOTE — Telephone Encounter (Signed)
Requested Prescriptions   Signed Prescriptions Disp Refills  . metoprolol tartrate (LOPRESSOR) 25 MG tablet 30 tablet 3    Sig: Take 0.5 tablets (12.5 mg total) by mouth 2 (two) times daily.    Authorizing Provider: Lorine BearsARIDA, MUHAMMAD A    Ordering User: Margrett RudSLAYTON, Jarin Cornfield N

## 2017-01-10 ENCOUNTER — Telehealth: Payer: Self-pay | Admitting: Cardiovascular Disease

## 2017-01-10 NOTE — Telephone Encounter (Signed)
Pt c/o of Chest Pain: STAT if CP now or developed within 24 hours  1. Are you having CP right now? No   2. Are you experiencing any other symptoms (ex. SOB, nausea, vomiting, sweating)? No   3. How long have you been experiencing CP? Once in July  4. Is your CP continuous or coming and going? Isolated episode   5. Have you taken Nitroglycerin? No    Spouse wants patient seen .  Patient scheduled for 8/6 at 120 pm  ?

## 2017-01-10 NOTE — Telephone Encounter (Signed)
Per wife, patient had an episode of chest pressure on July 14th. Episode only lasted about 2-3 minutes. Patient did not experience any shortness of breath. Patient did not take any nitro. Chest pain went away on its own. Patient seen PCP today and PCP advised patient follow up with cardiology. Appointment scheduled for 8/6. Advised wife if chest pain returned with any shortness of breath, patient would need to go to the emergency room to be evaluated.

## 2017-01-13 ENCOUNTER — Ambulatory Visit (INDEPENDENT_AMBULATORY_CARE_PROVIDER_SITE_OTHER): Payer: Managed Care, Other (non HMO) | Admitting: Cardiovascular Disease

## 2017-01-13 ENCOUNTER — Encounter: Payer: Self-pay | Admitting: Cardiovascular Disease

## 2017-01-13 VITALS — BP 118/70 | HR 51 | Ht 66.0 in | Wt 176.0 lb

## 2017-01-13 DIAGNOSIS — I25119 Atherosclerotic heart disease of native coronary artery with unspecified angina pectoris: Secondary | ICD-10-CM | POA: Diagnosis not present

## 2017-01-13 DIAGNOSIS — R079 Chest pain, unspecified: Secondary | ICD-10-CM | POA: Diagnosis not present

## 2017-01-13 DIAGNOSIS — E785 Hyperlipidemia, unspecified: Secondary | ICD-10-CM

## 2017-01-13 NOTE — Patient Instructions (Addendum)
Medication Instructions:  Your physician recommends that you continue on your current medications as directed. Please refer to the Current Medication list given to you today.   Labwork: none  Testing/Procedures: Your physician has requested that you have a lexiscan myoview. For further information please visit [email or web address]. Please follow instruction sheet, as given.  [facility] MYOVIEW  Your caregiver has ordered a Stress Test with nuclear imaging. The purpose of this test is to evaluate the blood supply to your heart muscle. This procedure is referred to as a "Non-Invasive Stress Test." This is because other than having an IV started in your vein, nothing is inserted or "invades" your body. Cardiac stress tests are done to find areas of poor blood flow to the heart by determining the extent of coronary artery disease (CAD). Some patients exercise on a treadmill, which naturally increases the blood flow to your heart, while others who are  unable to walk on a treadmill due to physical limitations have a pharmacologic/chemical stress agent called Lexiscan . This medicine will mimic walking on a treadmill by temporarily increasing your coronary blood flow.   Please note: these test may take anywhere between 2-4 hours to complete  [facility]  THE VOLUNTEERS AT THE FIRST DESK WILL DIRECT YOU WHERE TO GO  Date of Procedure: Friday, August 10 Arrival Time for Procedure: 7:45am  Instructions regarding medication:    _xx___:  Hold metoprolol the morning of procedure   PLEASE NOTIFY THE OFFICE AT LEAST 24 HOURS IN ADVANCE IF YOU ARE UNABLE TO KEEP YOUR APPOINTMENT.  [phone] AND  PLEASE NOTIFY [facility] AT [facility] AT LEAST 24 HOURS IN ADVANCE IF YOU ARE UNABLE TO KEEP YOUR APPOINTMENT. [phone]  How to prepare for your Myoview test:  1. Do not eat or drink after midnight 2. No caffeine for 24 hours prior to test 3. No smoking 24 hours prior to  test. 4. Your medication may be taken with water.  If your doctor stopped a medication because of this test, do not take that medication. 5. Ladies, please do not wear dresses.  Skirts or pants are appropriate. Please wear a short sleeve shirt. 6. No perfume, cologne or lotion. 7. Wear comfortable walking shoes. No heels!            Follow-Up: Your physician recommends that you schedule a follow-up appointment in: 6 months with Dr. Kirke CorinArida.    Any Other Special Instructions Will Be Listed Below (If Applicable).     If you need a refill on your cardiac medications before your next appointment, please call your pharmacy.  Cardiac Nuclear Scan A cardiac nuclear scan is a test that measures blood flow to the heart when a person is resting and when he or she is exercising. The test looks for problems such as:  Not enough blood reaching a portion of the heart.  The heart muscle not working normally.  You may need this test if:  You have heart disease.  You have had abnormal lab results.  You have had heart surgery or angioplasty.  You have chest pain.  You have shortness of breath.  In this test, a radioactive dye (tracer) is injected into your bloodstream. After the tracer has traveled to your heart, an imaging device is used to measure how much of the tracer is absorbed by or distributed to various areas of your heart. This procedure is usually done at a hospital and takes 2-4 hours. Tell a health care  provider about:  Any allergies you have.  All medicines you are taking, including vitamins, herbs, eye drops, creams, and over-the-counter medicines.  Any problems you or family members have had with the use of anesthetic medicines.  Any blood disorders you have.  Any surgeries you have had.  Any medical conditions you have.  Whether you are pregnant or may be pregnant. What are the risks? Generally, this is a safe procedure. However, problems may occur,  including:  Serious chest pain and heart attack. This is only a risk if the stress portion of the test is done.  Rapid heartbeat.  Sensation of warmth in your chest. This usually passes quickly.  What happens before the procedure?  Ask your health care provider about changing or stopping your regular medicines. This is especially important if you are taking diabetes medicines or blood thinners.  Remove your jewelry on the day of the procedure. What happens during the procedure?  An IV tube will be inserted into one of your veins.  Your health care provider will inject a small amount of radioactive tracer through the tube.  You will wait for 20-40 minutes while the tracer travels through your bloodstream.  Your heart activity will be monitored with an electrocardiogram (ECG).  You will lie down on an exam table.  Images of your heart will be taken for about 15-20 minutes.  You may be asked to exercise on a treadmill or stationary bike. While you exercise, your heart's activity will be monitored with an ECG, and your blood pressure will be checked. If you are unable to exercise, you may be given a medicine to increase blood flow to parts of your heart.  When blood flow to your heart has peaked, a tracer will again be injected through the IV tube.  After 20-40 minutes, you will get back on the exam table and have more images taken of your heart.  When the procedure is over, your IV tube will be removed. The procedure may vary among health care providers and hospitals. Depending on the type of tracer used, scans may need to be repeated 3-4 hours later. What happens after the procedure?  Unless your health care provider tells you otherwise, you may return to your normal schedule, including diet, activities, and medicines.  Unless your health care provider tells you otherwise, you may increase your fluid intake. This will help flush the contrast dye from your body. Drink enough fluid  to keep your urine clear or pale yellow.  It is up to you to get your test results. Ask your health care provider, or the department that is doing the test, when your results will be ready. Summary  A cardiac nuclear scan measures the blood flow to the heart when a person is resting and when he or she is exercising.  You may need this test if you are at risk for heart disease.  Tell your health care provider if you are pregnant.  Unless your health care provider tells you otherwise, increase your fluid intake. This will help flush the contrast dye from your body. Drink enough fluid to keep your urine clear or pale yellow. This information is not intended to replace advice given to you by your health care provider. Make sure you discuss any questions you have with your health care provider. Document Released: 06/21/2004 Document Revised: 05/29/2016 Document Reviewed: 05/05/2013 Elsevier Interactive Patient Education  2017 [employer].

## 2017-01-13 NOTE — Progress Notes (Signed)
Cardiology Office Note   Date:  01/13/2017   ID:  Jeff Howard, DOB 10/02/1960, MRN [id number]  PCP:  Nonda LouShapely-Quinn, Todd [place], MD  Cardiologist:   Lorine BearsMuhammad Danthony Kendrix, MD   Chief Complaint  Patient presents with  . other    Pt. c/o chest pain with left arm pain that started 3 weeks ago that comes and goes. Meds reviewed by the pt. verbally.       History of Present Illness: Jeff Howard is a 56 y.o. male who presents for a follow-up visit regarding coronary artery disease and stenting of the LAD in September 2016. He presented in September 2016 with non-ST elevation myocardial infarction with borderline anterior ST elevation. Cardiac catheterization revealed 99% mid LAD stenosis and 60% mid left circumflex stenosis. Ejection fraction was 45-50% with distal anterior wall and apical hypokinesis. He underwent successful angioplasty and 2 overlapped drug-eluting stent placement to the mid LAD. A second stent was placed for suspected distal edge dissection.   He developed a rash which was thought to be due to Effient. This was switched to Plavix with resolution.   He was admitted to my schedule due to 2 recent episodes of chest pain. One happened about 2 weeks ago while he was at a party. It was substernal tightness feeling that radiated to his left arm and lasted for about 5 minutes. He had a second episode about a week later which was substernal sharp discomfort that woke him up from sleep at 5 AM. He reports no exertional symptoms. No associated shortness of breath. He has been taking his medications regularly.  Past Medical History:  Diagnosis Date  . CAD (coronary artery disease)    a. 02/2015 NSTEMI/PCI: LM nl, LAD 5357m (2.75x23 Xience/2.5x8 Xience DES), D2 20ost, LCX 8168m, RCA 2012m, RPDA1/2 40, EF 45-50.  . Diabetes mellitus without complication (HCC)   . Essential hypertension   . Hyperlipemia   . Ischemic cardiomyopathy    a. 02/2015 Echo: Ef 45-50%, mild  apicalanterior HK, sev apical HK, triv MR.    Past Surgical History:  Procedure Laterality Date  . arm surgery     left  . CARDIAC CATHETERIZATION N/A 03/06/2015   Procedure: Left Heart Cath and Coronary Angiography;  Surgeon: Iran OuchMuhammad A Keajah Killough, MD;  Location: [facility] INVASIVE CV LAB;  Service: Cardiovascular;  Laterality: N/A;  . CARDIAC CATHETERIZATION N/A 03/06/2015   Procedure: Coronary Stent Intervention;  Surgeon: Iran OuchMuhammad A Woodrow Dulski, MD;  Location: [facility] INVASIVE CV LAB;  Service: Cardiovascular;  Laterality: N/A;     Current Outpatient Prescriptions  Medication Sig Dispense Refill  . aspirin 81 MG tablet Take 81 mg by mouth daily.    Marland Kitchen. atorvastatin (LIPITOR) 80 MG tablet Take 1 tablet (80 mg total) by mouth daily at 6 PM. 90 tablet 0  . clopidogrel (PLAVIX) 75 MG tablet Take 1 tablet (75 mg total) by mouth daily. 30 tablet 3  . diphenhydrAMINE (BENADRYL) 25 MG tablet Take 1 tablet (25 mg total) by mouth every 6 (six) hours as needed. 30 tablet 0  . EPINEPHrine 0.3 mg/0.3 mL IJ SOAJ injection Inject 0.3 mLs (0.3 mg total) into the muscle once. Follow package instructions as needed for severe allergy or anaphylactic reaction. 1 Device 2  . lisinopril (PRINIVIL,ZESTRIL) 5 MG tablet Take 5 mg by mouth daily.    . metFORMIN (GLUCOPHAGE-XR) 500 MG 24 hr tablet Take 1,500 mg by mouth daily after supper.     . metoprolol tartrate (LOPRESSOR) 25 MG  tablet Take 0.5 tablets (12.5 mg total) by mouth 2 (two) times daily. (Patient taking differently: Take 12.5 mg by mouth daily. ) 30 tablet 3  . nitroGLYCERIN (NITROSTAT) 0.4 MG SL tablet Place 1 tablet (0.4 mg total) under the tongue every 5 (five) minutes as needed for chest pain. 25 tablet 3  . ondansetron (ZOFRAN ODT) 4 MG disintegrating tablet Take 1 tablet (4 mg total) by mouth every 8 (eight) hours as needed for nausea or vomiting. 20 tablet 0  . saxagliptin HCl (ONGLYZA) 5 MG TABS tablet Take 5 mg by mouth daily.    . pioglitazone (ACTOS) 45 MG  tablet Take 45 mg by mouth daily.     No current facility-administered medications for this visit.     Allergies:   Effient [prasugrel]    Social History:  The patient  reports that he has never smoked. He has never used smokeless tobacco. He reports that he does not drink alcohol or use drugs.   Family History:  The patient's family history includes Diabetes in his other; Heart attack in his father.    ROS:  Please see the history of present illness.   Otherwise, review of systems are positive for none.   All other systems are reviewed and negative.    PHYSICAL EXAM: VS:  BP 118/70 (BP Location: Left Arm, Patient Position: Sitting, Cuff Size: Normal)   Pulse (!) 51   Ht 5\' 6"  (1.676 m)   Wt 176 lb (79.8 kg)   BMI 28.41 kg/m  , BMI Body mass index is 28.41 kg/m. GEN: Well nourished, well developed, in no acute distress  HEENT: normal  Neck: no JVD, carotid bruits, or masses Cardiac: RRR; no murmurs, rubs, or gallops,no edema  Respiratory:  clear to auscultation bilaterally, normal work of breathing GI: soft, nontender, nondistended, + BS MS: no deformity or atrophy  Skin: warm and dry, no rash Neuro:  Strength and sensation are intact Psych: euthymic mood, full affect   EKG:  EKG is ordered today. EKG showed sinus bradycardia with no significant ST or T wave changes.    Recent Labs: No results found for requested labs within last 8760 hours.    Lipid Panel    Component Value Date/Time   CHOL 140 03/06/2015 0011   TRIG 60 03/06/2015 0011   HDL 52 03/06/2015 0011   CHOLHDL 2.7 03/06/2015 0011   VLDL 12 03/06/2015 0011   LDLCALC 76 03/06/2015 0011      Wt Readings from Last 3 Encounters:  01/13/17 176 lb (79.8 kg)  02/15/16 174 lb 12 oz (79.3 kg)  08/18/15 170 lb 12 oz (77.5 kg)        ASSESSMENT AND PLAN:  1.  Coronary artery disease involving native coronary arteries  With atypical chest pain. 2 recent episodes of atypical chest pain with known  history of coronary artery disease. EKG does not show any acute changes. Given previous stenting and residual left circumflex disease, I recommend evaluation with a treadmill nuclear stress test. Hold metoprolol the morning of stress test. Continue current medications.  2. Hyperlipidemia: Continue high dose atorvastatin. Most recent LDL was close to 70.  3. Ischemic cardiomyopathy: Ejection fraction was 45-50%. He has no symptoms suggestive of volume overload or heart failure. Continue small dose metoprolol and lisinopril.   Disposition:   FU with me in 6 months  Signed,  [name], MD  01/13/2017 1:42 PM    [facility]

## 2017-01-17 ENCOUNTER — Encounter: Admission: RE | Admit: 2017-01-17 | Payer: Managed Care, Other (non HMO) | Source: Ambulatory Visit

## 2017-01-17 ENCOUNTER — Telehealth: Payer: Self-pay | Admitting: Cardiovascular Disease

## 2017-01-17 NOTE — Telephone Encounter (Signed)
[facility] to let us know patient no showed for the Myoview this morning

## 2017-01-17 NOTE — Telephone Encounter (Signed)
Called pt. Mailbox is full.  Will call again

## 2017-01-20 NOTE — Telephone Encounter (Signed)
Attempted to reach patient to reschedule myoview. Pt's VM is full

## 2017-01-21 NOTE — Telephone Encounter (Signed)
Called pt. No answer, mailbox is full. Unable to leave VM. Letter mailed.

## 2017-01-27 NOTE — Telephone Encounter (Signed)
Pt spouse calling back Stated they received the letter from our office.  Please call back

## 2017-01-27 NOTE — Telephone Encounter (Signed)
S/w pt's wife about rescheduling treadmill myoview ordered August 8 for chest pain. As pt has not met his $3000 deductible, he would have to pay for test out of pocket. Inquires if there is more affordable testing to evaluate his chest pain. Will route to Dr. Fletcher Anon for possible GXT.

## 2017-01-28 ENCOUNTER — Other Ambulatory Visit: Payer: Self-pay

## 2017-01-28 DIAGNOSIS — R079 Chest pain, unspecified: Secondary | ICD-10-CM

## 2017-01-28 NOTE — Telephone Encounter (Signed)
Schedule GXT. Hold metoprolol the morning of test.

## 2017-01-28 NOTE — Telephone Encounter (Signed)
Placed order for GXT and sent message to [facility] billing for assistance with determining cost of test. S/w wife who is agreeable and asks I s/w pt to schedule. Awaiting response from billing before scheduling.

## 2017-02-04 NOTE — Telephone Encounter (Signed)
S/w pt's wife, Genella Rife. She states they s/w billing and would like to schedule GXT. Reviewed instructions including need to hold metoprolol the morning of the test. Schedule Sept 10, 10:30am. Interpreter requested.

## 2017-02-14 ENCOUNTER — Telehealth: Payer: Self-pay | Admitting: *Deleted

## 2017-02-14 NOTE — Telephone Encounter (Signed)
No answer. Left message reminding patient of GXT on 02/17/17 with pre-procedural instructions and to call back if he has any questions.

## 2017-02-17 ENCOUNTER — Ambulatory Visit (INDEPENDENT_AMBULATORY_CARE_PROVIDER_SITE_OTHER): Payer: Managed Care, Other (non HMO)

## 2017-02-17 DIAGNOSIS — R079 Chest pain, unspecified: Secondary | ICD-10-CM | POA: Diagnosis not present

## 2017-02-20 ENCOUNTER — Other Ambulatory Visit: Payer: Self-pay | Admitting: Cardiovascular Disease

## 2017-02-20 ENCOUNTER — Ambulatory Visit: Payer: Self-pay | Admitting: Cardiovascular Disease

## 2017-02-20 LAB — EXERCISE TOLERANCE TEST
CSEPEDS: 22 s
CSEPPHR: 146 {beats}/min
Estimated workload: 13.4 METS
Exercise duration (min): 11 min
MPHR: 162 {beats}/min
Percent HR: 90 %
Rest HR: 56 {beats}/min

## 2017-05-22 ENCOUNTER — Other Ambulatory Visit: Payer: Self-pay | Admitting: Cardiovascular Disease

## 2017-05-23 ENCOUNTER — Other Ambulatory Visit: Payer: Self-pay | Admitting: Cardiovascular Disease

## 2018-02-03 ENCOUNTER — Ambulatory Visit: Payer: 59 | Admitting: Cardiovascular Disease

## 2018-02-03 ENCOUNTER — Encounter: Payer: Self-pay | Admitting: Cardiovascular Disease

## 2018-02-03 VITALS — BP 114/60 | HR 57 | Ht 66.0 in | Wt 182.0 lb

## 2018-02-03 DIAGNOSIS — I10 Essential (primary) hypertension: Secondary | ICD-10-CM

## 2018-02-03 DIAGNOSIS — I2510 Atherosclerotic heart disease of native coronary artery without angina pectoris: Secondary | ICD-10-CM

## 2018-02-03 DIAGNOSIS — E785 Hyperlipidemia, unspecified: Secondary | ICD-10-CM

## 2018-02-03 DIAGNOSIS — I255 Ischemic cardiomyopathy: Secondary | ICD-10-CM | POA: Diagnosis not present

## 2018-02-03 MED ORDER — CARVEDILOL 3.125 MG PO TABS: 3.1250 mg | ORAL_TABLET | Freq: Two times a day (BID) | ORAL | 3 refills | Status: DC

## 2018-02-03 NOTE — Patient Instructions (Signed)
Medication Instructions: STOP the Metoprolol START Carvedilol 3.125 mg twice daily  If you need a refill on your cardiac medications before your next appointment, please call your pharmacy.   Follow-Up: Your physician wants you to follow-up in 12 months with Dr. Kirke CorinArida. You will receive a reminder letter in the mail two months in advance. If you don't receive a letter, please call our office at [phone] to schedule this follow-up appointment.  Thank you [facility]!

## 2018-02-03 NOTE — Progress Notes (Signed)
Cardiology Office Note   Date:  02/03/2018   ID:  Jeff Howard, DOB 03/02/1961, MRN [id number]  PCP:  Nonda LouShapely-Quinn, Todd [place], MD  Cardiologist:   Lorine BearsMuhammad Sharon Rubis, MD   Chief Complaint  Patient presents with  . Other    6 month follow up. Patient denies chest pain and SOB at this time. Meds reviewed verbally with patient.       History of Present Illness: Jeff Howard is a 57 y.o. male who presents for a follow-up visit regarding coronary artery disease and stenting of the LAD in September 2016. He presented in September 2016 with non-ST elevation myocardial infarction with borderline anterior ST elevation. Cardiac catheterization revealed 99% mid LAD stenosis and 60% mid left circumflex stenosis. Ejection fraction was 45-50% with distal anterior wall and apical hypokinesis. He underwent successful angioplasty and 2 overlapped drug-eluting stent placement to the mid LAD. A second stent was placed for suspected distal edge dissection.    He had atypical chest pain last year.  He underwent a treadmill stress test which showed no evidence of ischemia.  He was able to exercise for 11 minutes and 22 seconds.  He has been doing very well with no recent chest pain, shortness of breath or palpitations.  He takes his medications regularly.  Past Medical History:  Diagnosis Date  . CAD (coronary artery disease)    a. 02/2015 NSTEMI/PCI: LM nl, LAD 3369m (2.75x23 Xience/2.5x8 Xience DES), D2 20ost, LCX 8569m, RCA 2752m, RPDA1/2 40, EF 45-50.  . Diabetes mellitus without complication (HCC)   . Essential hypertension   . Hyperlipemia   . Ischemic cardiomyopathy    a. 02/2015 Echo: Ef 45-50%, mild apicalanterior HK, sev apical HK, triv MR.    Past Surgical History:  Procedure Laterality Date  . arm surgery     left  . CARDIAC CATHETERIZATION N/A 03/06/2015   Procedure: Left Heart Cath and Coronary Angiography;  Surgeon: Iran OuchMuhammad A Melesio Madara, MD;  Location: [facility] INVASIVE CV LAB;   Service: Cardiovascular;  Laterality: N/A;  . CARDIAC CATHETERIZATION N/A 03/06/2015   Procedure: Coronary Stent Intervention;  Surgeon: Iran OuchMuhammad A Kobi Mario, MD;  Location: [facility] INVASIVE CV LAB;  Service: Cardiovascular;  Laterality: N/A;     Current Outpatient Medications  Medication Sig Dispense Refill  . aspirin 81 MG tablet Take 81 mg by mouth daily.    Marland Kitchen. atorvastatin (LIPITOR) 80 MG tablet Take 1 tablet (80 mg total) by mouth daily at 6 PM. 90 tablet 0  . clopidogrel (PLAVIX) 75 MG tablet Take 1 tablet (75 mg total) by mouth daily. 30 tablet 3  . EPINEPHrine 0.3 mg/0.3 mL IJ SOAJ injection Inject 0.3 mLs (0.3 mg total) into the muscle once. Follow package instructions as needed for severe allergy or anaphylactic reaction. 1 Device 2  . lisinopril (PRINIVIL,ZESTRIL) 5 MG tablet Take 5 mg by mouth daily.    . metFORMIN (GLUCOPHAGE-XR) 500 MG 24 hr tablet Take 500 mg by mouth daily after supper.     . nitroGLYCERIN (NITROSTAT) 0.4 MG SL tablet Place 1 tablet (0.4 mg total) under the tongue every 5 (five) minutes as needed for chest pain. 25 tablet 3  . pioglitazone (ACTOS) 45 MG tablet Take 45 mg by mouth daily.    . carvedilol (COREG) 3.125 MG tablet Take 1 tablet (3.125 mg total) by mouth 2 (two) times daily. 180 tablet 3   No current facility-administered medications for this visit.     Allergies:   Effient [prasugrel]  Social History:  The patient  reports that he has never smoked. He has never used smokeless tobacco. He reports that he does not drink alcohol or use drugs.   Family History:  The patient's family history includes Diabetes in his other; Heart attack in his father.    ROS:  Please see the history of present illness.   Otherwise, review of systems are positive for none.   All other systems are reviewed and negative.    PHYSICAL EXAM: VS:  BP 114/60 (BP Location: Left Arm, Patient Position: Sitting, Cuff Size: Normal)   Pulse (!) 57   Ht 5\' 6"  (1.676 m)   Wt 182 lb  (82.6 kg)   BMI 29.38 kg/m  , BMI Body mass index is 29.38 kg/m. GEN: Well nourished, well developed, in no acute distress  HEENT: normal  Neck: no JVD, carotid bruits, or masses Cardiac: RRR; no murmurs, rubs, or gallops,no edema  Respiratory:  clear to auscultation bilaterally, normal work of breathing GI: soft, nontender, nondistended, + BS MS: no deformity or atrophy  Skin: warm and dry, no rash Neuro:  Strength and sensation are intact Psych: euthymic mood, full affect   EKG:  EKG is ordered today. EKG showed sinus bradycardia with no significant ST or T wave changes.  Heart rate is 57 bpm.    Recent Labs: No results found for requested labs within last 8760 hours.    Lipid Panel    Component Value Date/Time   CHOL 140 03/06/2015 0011   TRIG 60 03/06/2015 0011   HDL 52 03/06/2015 0011   CHOLHDL 2.7 03/06/2015 0011   VLDL 12 03/06/2015 0011   LDLCALC 76 03/06/2015 0011      Wt Readings from Last 3 Encounters:  02/03/18 182 lb (82.6 kg)  01/13/17 176 lb (79.8 kg)  02/15/16 174 lb 12 oz (79.3 kg)        ASSESSMENT AND PLAN:  1.  Coronary artery disease involving native coronary arteries without angina: He is doing extremely well.  Stress test was last year was low risk.  Recommend continuing medical therapy.  Given 2 overlapped stents in the LAD, I favor long-term dual antiplatelet therapy for now.   2. Hyperlipidemia: Continue high dose atorvastatin.  I reviewed most recent lipid profile done this month which showed a total cholesterol of 109, triglyceride 47, LDL of 51 and HDL of 49.  3. Ischemic cardiomyopathy: Ejection fraction was 45-50%. He has no symptoms suggestive of volume overload or heart failure.  Given baseline bradycardia and the presence of diabetes, I elected to switch him from metoprolol to carvedilol.  Disposition:   FU with me in 12 months  Signed,  [name], MD  02/03/2018 3:39 PM    [facility]

## 2018-02-23 ENCOUNTER — Ambulatory Visit: Payer: 59 | Admitting: Rehabilitation

## 2018-02-23 ENCOUNTER — Ambulatory Visit: Payer: 59 | Admitting: Occupational Therapy

## 2018-03-13 ENCOUNTER — Other Ambulatory Visit: Payer: Self-pay | Admitting: Cardiovascular Disease

## 2018-07-15 ENCOUNTER — Other Ambulatory Visit: Payer: Self-pay | Admitting: Cardiovascular Disease

## 2019-01-21 ENCOUNTER — Other Ambulatory Visit: Payer: Self-pay

## 2019-01-21 MED ORDER — CLOPIDOGREL BISULFATE 75 MG PO TABS: 75.0000 mg | ORAL_TABLET | Freq: Every day | ORAL | 1 refills | Status: DC

## 2019-01-21 NOTE — Telephone Encounter (Signed)
*  STAT* If patient is at the pharmacy, call can be transferred to refill team.   1. Which medications need to be refilled? (please list name of each medication and dose if known) PLAVIX  2. Which pharmacy/location (including street and city if local pharmacy) is medication to be sent to? [facility]  3. Do they need a 30 day or 90 day supply? [place]

## 2019-02-04 ENCOUNTER — Encounter: Payer: Self-pay | Admitting: Cardiovascular Disease

## 2019-02-04 ENCOUNTER — Other Ambulatory Visit: Payer: Self-pay

## 2019-02-04 ENCOUNTER — Ambulatory Visit: Payer: 59 | Admitting: Cardiovascular Disease

## 2019-02-04 VITALS — BP 142/80 | HR 50 | Ht 65.0 in | Wt 175.5 lb

## 2019-02-04 DIAGNOSIS — I255 Ischemic cardiomyopathy: Secondary | ICD-10-CM | POA: Diagnosis not present

## 2019-02-04 DIAGNOSIS — I10 Essential (primary) hypertension: Secondary | ICD-10-CM | POA: Diagnosis not present

## 2019-02-04 DIAGNOSIS — I2510 Atherosclerotic heart disease of native coronary artery without angina pectoris: Secondary | ICD-10-CM

## 2019-02-04 MED ORDER — CARVEDILOL 3.125 MG PO TABS: 3.1250 mg | ORAL_TABLET | Freq: Two times a day (BID) | ORAL | 3 refills | Status: DC

## 2019-02-04 MED ORDER — LISINOPRIL 5 MG PO TABS: 5.0000 mg | ORAL_TABLET | Freq: Every day | ORAL | 3 refills | Status: DC

## 2019-02-04 MED ORDER — ATORVASTATIN CALCIUM 80 MG PO TABS: 80.0000 mg | ORAL_TABLET | Freq: Every day | ORAL | 3 refills | Status: DC

## 2019-02-04 MED ORDER — CLOPIDOGREL BISULFATE 75 MG PO TABS: 75.0000 mg | ORAL_TABLET | Freq: Every day | ORAL | 3 refills | Status: DC

## 2019-02-04 NOTE — Progress Notes (Signed)
Cardiology Office Note   Date:  02/04/2019   ID:  Jeff, Howard Oct 11, 1960, MRN [id number]  PCP:  Cecile Sheerer, MD  Cardiologist:   Kathlyn [place], MD   Chief Complaint  Patient presents with  . Other    12 month follow up. Patient denies chest pain and SOB. Meds reviewed verbally with patient.       History of Present Illness: Jeff Howard is a 58 y.o. male who presents for a follow-up visit regarding coronary artery disease and stenting of the LAD in September 2016. He presented in September 2016 with non-ST elevation myocardial infarction with borderline anterior ST elevation. Cardiac catheterization revealed 99% mid LAD stenosis and 60% mid left circumflex stenosis. Ejection fraction was 45-50% with distal anterior wall and apical hypokinesis. He underwent successful angioplasty and 2 overlapped drug-eluting stent placement to the mid LAD. A second stent was placed for suspected distal edge dissection.    He had atypical chest pain in 2018.  He underwent a treadmill stress test which showed no evidence of ischemia.  He was able to exercise for 11 minutes and 22 seconds.  I switch him from metoprolol to carvedilol last year due to underlying bradycardia.  He has been doing extremely well with no recent chest pain, shortness of breath or palpitations.  No side effects with medications.  Past Medical History:  Diagnosis Date  . CAD (coronary artery disease)    a. 02/2015 NSTEMI/PCI: LM nl, LAD 21m (2.75x23 Xience/2.5x8 Xience DES), D2 20ost, LCX 38m, RCA 51m, RPDA1/2 40, EF 45-50.  . Diabetes mellitus without complication ([place])   . Essential hypertension   . Hyperlipemia   . Ischemic cardiomyopathy    a. 02/2015 Echo: Ef 45-50%, mild apicalanterior HK, sev apical HK, triv MR.    Past Surgical History:  Procedure Laterality Date  . arm surgery     left  . CARDIAC CATHETERIZATION N/A 03/06/2015   Procedure: Left Heart Cath and Coronary  Angiography;  Surgeon: Wellington Hampshire, MD;  Location: [place] [facility];  Service: Cardiovascular;  Laterality: N/A;  . CARDIAC CATHETERIZATION N/A 03/06/2015   Procedure: Coronary Stent Intervention;  Surgeon: Wellington Hampshire, MD;  Location: [place] [facility];  Service: Cardiovascular;  Laterality: N/A;     Current Outpatient Medications  Medication Sig Dispense Refill  . aspirin 81 MG tablet Take 81 mg by mouth daily.    Marland Kitchen atorvastatin (LIPITOR) 80 MG tablet Take 1 tablet (80 mg total) by mouth daily at 6 PM. 90 tablet 0  . clopidogrel (PLAVIX) 75 MG tablet Take 1 tablet (75 mg total) by mouth daily. 30 tablet 1  . EPINEPHrine 0.3 mg/0.3 mL IJ SOAJ injection Inject 0.3 mLs (0.3 mg total) into the muscle once. Follow package instructions as needed for severe allergy or anaphylactic reaction. 1 Device 2  . lisinopril (PRINIVIL,ZESTRIL) 5 MG tablet Take 5 mg by mouth daily.    . metFORMIN (GLUCOPHAGE-XR) 500 MG 24 hr tablet Take 500 mg by mouth daily after supper.     . nitroGLYCERIN (NITROSTAT) 0.4 MG SL tablet Place 1 tablet (0.4 mg total) under the tongue every 5 (five) minutes as needed for chest pain. 25 tablet 3  . pioglitazone (ACTOS) 45 MG tablet Take 45 mg by mouth daily.    . carvedilol (COREG) 3.125 MG tablet Take 1 tablet (3.125 mg total) by mouth 2 (two) times daily. 180 tablet 3   No current facility-administered medications for  this visit.     Allergies:   Effient [prasugrel]    Social History:  The patient  reports that he has never smoked. He has never used smokeless tobacco. He reports that he does not drink alcohol or use drugs.   Family History:  The patient's family history includes Diabetes in an other family member; Heart attack in his father.    ROS:  Please see the history of present illness.   Otherwise, review of systems are positive for none.   All other systems are reviewed and negative.    PHYSICAL EXAM: VS:  BP (!) 142/80 (BP Location: Left Arm,  Patient Position: Sitting, Cuff Size: Normal)   Pulse (!) 50   Ht 5\' 5"  (1.651 m)   Wt 175 lb 8 oz (79.6 kg)   BMI 29.20 kg/m  , BMI Body mass index is 29.2 kg/m. GEN: Well nourished, well developed, in no acute distress  HEENT: normal  Neck: no JVD, carotid bruits, or masses Cardiac: RRR; no murmurs, rubs, or gallops,no edema  Respiratory:  clear to auscultation bilaterally, normal work of breathing GI: soft, nontender, nondistended, + BS MS: no deformity or atrophy  Skin: warm and dry, no rash Neuro:  Strength and sensation are intact Psych: euthymic mood, full affect   EKG:  EKG is ordered today. EKG showed sinus bradycardia with no significant ST or T wave changes.  Heart rate is 50 bpm    Recent Labs: No results found for requested labs within last 8760 hours.    Lipid Panel    Component Value Date/Time   CHOL 140 03/06/2015 0011   TRIG 60 03/06/2015 0011   HDL 52 03/06/2015 0011   CHOLHDL 2.7 03/06/2015 0011   VLDL 12 03/06/2015 0011   LDLCALC 76 03/06/2015 0011      Wt Readings from Last 3 Encounters:  02/04/19 175 lb 8 oz (79.6 kg)  02/03/18 182 lb (82.6 kg)  01/13/17 176 lb (79.8 kg)        ASSESSMENT AND PLAN:  1.  Coronary artery disease involving native coronary arteries without angina: He is doing extremely well.   Recommend continuing medical therapy.  Given 2 overlapped stents in the LAD, I favor long-term dual antiplatelet therapy for now.   2. Hyperlipidemia: Continue high dose atorvastatin.  Most recent lipid profile from last year showed an LDL of 51.   3. Ischemic cardiomyopathy: Ejection fraction was 45-50%. He has no symptoms suggestive of volume overload or heart failure.  Continue small dose carvedilol and lisinopril.  His blood pressure is slightly elevated today but usually his blood pressure is around 110 systolic.  I made no changes.  Disposition:   FU with me in 12 months  Signed,  Lorine BearsMuhammad , MD  02/04/2019 8:48 AM      [facility]

## 2019-02-04 NOTE — Patient Instructions (Signed)
Medication Instructions:  Your physician recommends that you continue on your current medications as directed. Please refer to the Current Medication list given to you today.  Your cardiac medications have been refilled today  If you need a refill on your cardiac medications before your next appointment, please call your pharmacy.   Lab work: None ordered If you have labs (blood work) drawn today and your tests are completely normal, you will receive your results only by: Marland Kitchen MyChart Message (if you have MyChart) OR . A paper copy in the mail If you have any lab test that is abnormal or we need to change your treatment, we will call you to review the results.  Testing/Procedures: None ordered  Follow-Up: At [facility], you and your health needs are our priority.  As part of our continuing mission to provide you with exceptional heart care, we have created designated Provider Care Teams.  These Care Teams include your primary Cardiologist (physician) and Advanced Practice Providers (APPs -  Physician Assistants and Nurse Practitioners) who all work together to provide you with the care you need, when you need it. You will need a follow up appointment in 12 months.  Please call our office 2 months in advance to schedule this appointment.  You may see Dr.Arida  or one of the following Advanced Practice Providers on your designated Care Team:   Murray Hodgkins, NP Christell Faith, PA-C . Marrianne Mood, PA-C  Any Other Special Instructions Will Be Listed Below (If Applicable). N/A

## 2019-11-02 ENCOUNTER — Telehealth: Payer: Self-pay | Admitting: Cardiovascular Disease

## 2019-11-02 NOTE — Telephone Encounter (Signed)
   [facility] Pre-operative Risk Assessment    HEARTCARE STAFF: - Please ensure there is not already an duplicate clearance open for this procedure. - Under Visit Info/Reason for Call, type in Other and utilize the format Clearance MM/DD/YY or Clearance TBD. Do not use dashes or single digits. - If request is for dental extraction, please clarify the # of teeth to be extracted.  Request for surgical clearance:  1. What type of surgery is being performed? Colonoscopy   2. When is this surgery scheduled? 02/21/20  3. What type of clearance is required (medical clearance vs. Pharmacy clearance to hold med vs. Both)? both  4. Are there any medications that need to be held prior to surgery and how long? Stop Plavix - aspirin  5. Practice name and name of physician performing surgery? [facility] - Dr Madolyn Frieze  6. What is the office phone number? [phone]   7.   What is the office fax number? [phone]  8.   Anesthesia type (None, local, MAC, general) ? Not listed    Jeff Howard 11/02/2019, 4:46 PM  _________________________________________________________________   (provider comments below)

## 2019-11-03 ENCOUNTER — Other Ambulatory Visit: Payer: Self-pay

## 2019-11-03 ENCOUNTER — Emergency Department: Payer: 59

## 2019-11-03 ENCOUNTER — Encounter: Payer: Self-pay | Admitting: Emergency Medicine

## 2019-11-03 ENCOUNTER — Telehealth: Payer: Self-pay | Admitting: Cardiovascular Disease

## 2019-11-03 DIAGNOSIS — Z20822 Contact with and (suspected) exposure to covid-19: Secondary | ICD-10-CM | POA: Diagnosis not present

## 2019-11-03 DIAGNOSIS — Z7902 Long term (current) use of antithrombotics/antiplatelets: Secondary | ICD-10-CM | POA: Diagnosis not present

## 2019-11-03 DIAGNOSIS — Z91030 Bee allergy status: Secondary | ICD-10-CM | POA: Insufficient documentation

## 2019-11-03 DIAGNOSIS — Z955 Presence of coronary angioplasty implant and graft: Secondary | ICD-10-CM | POA: Insufficient documentation

## 2019-11-03 DIAGNOSIS — I252 Old myocardial infarction: Secondary | ICD-10-CM | POA: Diagnosis not present

## 2019-11-03 DIAGNOSIS — I340 Nonrheumatic mitral (valve) insufficiency: Secondary | ICD-10-CM | POA: Insufficient documentation

## 2019-11-03 DIAGNOSIS — I255 Ischemic cardiomyopathy: Secondary | ICD-10-CM | POA: Diagnosis not present

## 2019-11-03 DIAGNOSIS — Z79899 Other long term (current) drug therapy: Secondary | ICD-10-CM | POA: Diagnosis not present

## 2019-11-03 DIAGNOSIS — Z888 Allergy status to other drugs, medicaments and biological substances status: Secondary | ICD-10-CM | POA: Insufficient documentation

## 2019-11-03 DIAGNOSIS — Z8249 Family history of ischemic heart disease and other diseases of the circulatory system: Secondary | ICD-10-CM | POA: Diagnosis not present

## 2019-11-03 DIAGNOSIS — I10 Essential (primary) hypertension: Secondary | ICD-10-CM | POA: Diagnosis not present

## 2019-11-03 DIAGNOSIS — H5461 Unqualified visual loss, right eye, normal vision left eye: Principal | ICD-10-CM | POA: Insufficient documentation

## 2019-11-03 DIAGNOSIS — E785 Hyperlipidemia, unspecified: Secondary | ICD-10-CM | POA: Insufficient documentation

## 2019-11-03 DIAGNOSIS — R001 Bradycardia, unspecified: Secondary | ICD-10-CM | POA: Insufficient documentation

## 2019-11-03 DIAGNOSIS — I2510 Atherosclerotic heart disease of native coronary artery without angina pectoris: Secondary | ICD-10-CM | POA: Insufficient documentation

## 2019-11-03 DIAGNOSIS — Z833 Family history of diabetes mellitus: Secondary | ICD-10-CM | POA: Diagnosis not present

## 2019-11-03 DIAGNOSIS — Z87892 Personal history of anaphylaxis: Secondary | ICD-10-CM | POA: Insufficient documentation

## 2019-11-03 DIAGNOSIS — Z7982 Long term (current) use of aspirin: Secondary | ICD-10-CM | POA: Diagnosis not present

## 2019-11-03 DIAGNOSIS — H53131 Sudden visual loss, right eye: Secondary | ICD-10-CM | POA: Diagnosis present

## 2019-11-03 DIAGNOSIS — Z7984 Long term (current) use of oral hypoglycemic drugs: Secondary | ICD-10-CM | POA: Diagnosis not present

## 2019-11-03 LAB — COMPREHENSIVE METABOLIC PANEL
ALT: 24 U/L (ref 0–44)
AST: 20 U/L (ref 15–41)
Albumin: 4.1 g/dL (ref 3.5–5.0)
Alkaline Phosphatase: 66 U/L (ref 38–126)
Anion gap: 7 (ref 5–15)
BUN: 23 mg/dL (ref 8–23)
CO2: 27 mmol/L (ref 22–32)
Calcium: 9 mg/dL (ref 8.9–10.3)
Chloride: 105 mmol/L (ref 98–111)
Creatinine, Ser: 0.94 mg/dL (ref 0.61–1.24)
GFR calc Af Amer: >60 mL/min (ref >60)
GFR calc non Af Amer: >60 mL/min (ref >60)
Glucose, Bld: 112 mg/dL — ABNORMAL HIGH (ref 70–99)
Potassium: 4.3 mmol/L (ref 3.5–5.1)
Sodium: 139 mmol/L (ref 135–145)
Total Bilirubin: 0.8 mg/dL (ref 0.3–1.2)
Total Protein: 7 g/dL (ref 6.5–8.1)

## 2019-11-03 LAB — DIFFERENTIAL
Abs Immature Granulocytes: 0.01 10*3/uL (ref 0.00–0.07)
Basophils Absolute: 0 10*3/uL (ref 0.0–0.1)
Basophils Relative: 1 %
Eosinophils Absolute: 0.1 10*3/uL (ref 0.0–0.5)
Eosinophils Relative: 2 %
Immature Granulocytes: 0 %
Lymphocytes Relative: 35 %
Lymphs Abs: 2 10*3/uL (ref 0.7–4.0)
Monocytes Absolute: 0.7 10*3/uL (ref 0.1–1.0)
Monocytes Relative: 13 %
Neutro Abs: 2.8 10*3/uL (ref 1.7–7.7)
Neutrophils Relative %: 49 %

## 2019-11-03 LAB — CBC
HCT: 41.5 % (ref 39.0–52.0)
Hemoglobin: 13.8 g/dL (ref 13.0–17.0)
MCH: 29.2 pg (ref 26.0–34.0)
MCHC: 33.3 g/dL (ref 30.0–36.0)
MCV: 87.9 fL (ref 80.0–100.0)
Platelets: 196 10*3/uL (ref 150–400)
RBC: 4.72 MIL/uL (ref 4.22–5.81)
RDW: 13.2 % (ref 11.5–15.5)
WBC: 5.7 10*3/uL (ref 4.0–10.5)
nRBC: 0 % (ref 0.0–0.2)

## 2019-11-03 LAB — PROTIME-INR
INR: 1 (ref 0.8–1.2)
Prothrombin Time: 12.6 seconds (ref 11.4–15.2)

## 2019-11-03 LAB — APTT: aPTT: 27 seconds (ref 24–36)

## 2019-11-03 NOTE — Telephone Encounter (Signed)
Forwarded to [employer].

## 2019-11-03 NOTE — Telephone Encounter (Signed)
-----   Message from Alyson Ingles, LPN sent at 9/75/3005 11:14 AM EDT ----- Regarding: PRE-OP APPT

## 2019-11-03 NOTE — ED Triage Notes (Signed)
Pt presents to ED with sudden blindness in his right eye since this morning. Pt reports the blindness lasts approx 1 hour and happens every few hours occurring a total of 4 times today. No hx of the same. Pt states the vision in the affected eye goes completely black. Denies headache, eye pain or pressure, or dizziness. No blindness at this time.

## 2019-11-03 NOTE — Telephone Encounter (Signed)
Attempted to schedule . Call dropped .  ?

## 2019-11-03 NOTE — ED Notes (Signed)
Pt went to CT scan, will get EKG upon return.

## 2019-11-03 NOTE — Telephone Encounter (Signed)
Primary Cardiologist:No primary care provider on file.  Chart reviewed as part of pre-operative protocol coverage. Because of Jeff Howard's past medical history and time since last visit, he/she will require a follow-up visit in order to better assess preoperative cardiovascular risk.  Pre-op covering staff: - Please schedule appointment and call patient to inform them. - Please contact requesting surgeon's office via preferred method (i.e, phone, fax) to inform them of need for appointment prior to surgery.  If applicable, this message will also be routed to pharmacy pool and/or primary cardiologist for input on holding anticoagulant/antiplatelet agent as requested below so that this information is available at time of patient's appointment.   Ronney Asters, NP  11/03/2019, 9:00 AM

## 2019-11-04 ENCOUNTER — Observation Stay
Admission: EM | Admit: 2019-11-04 | Discharge: 2019-11-05 | Disposition: A | Discharge status: Home / Self Care | Payer: 59 | Attending: Internal Medicine | Admitting: Internal Medicine

## 2019-11-04 ENCOUNTER — Emergency Department: Payer: 59

## 2019-11-04 ENCOUNTER — Other Ambulatory Visit: Payer: Self-pay

## 2019-11-04 DIAGNOSIS — E119 Type 2 diabetes mellitus without complications: Secondary | ICD-10-CM

## 2019-11-04 DIAGNOSIS — R001 Bradycardia, unspecified: Secondary | ICD-10-CM

## 2019-11-04 DIAGNOSIS — H53131 Sudden visual loss, right eye: Secondary | ICD-10-CM | POA: Diagnosis not present

## 2019-11-04 DIAGNOSIS — I2510 Atherosclerotic heart disease of native coronary artery without angina pectoris: Secondary | ICD-10-CM | POA: Diagnosis not present

## 2019-11-04 DIAGNOSIS — G453 Amaurosis fugax: Secondary | ICD-10-CM | POA: Diagnosis not present

## 2019-11-04 DIAGNOSIS — I10 Essential (primary) hypertension: Secondary | ICD-10-CM | POA: Diagnosis present

## 2019-11-04 LAB — GLUCOSE, CAPILLARY
Glucose-Capillary: 106 mg/dL — ABNORMAL HIGH (ref 70–99)
Glucose-Capillary: 202 mg/dL — ABNORMAL HIGH (ref 70–99)
Glucose-Capillary: 110 mg/dL — ABNORMAL HIGH (ref 70–99)

## 2019-11-04 LAB — CBC
HCT: 40.1 % (ref 39.0–52.0)
Hemoglobin: 13.6 g/dL (ref 13.0–17.0)
MCH: 29.1 pg (ref 26.0–34.0)
MCHC: 33.9 g/dL (ref 30.0–36.0)
MCV: 85.9 fL (ref 80.0–100.0)
Platelets: 190 10*3/uL (ref 150–400)
RBC: 4.67 MIL/uL (ref 4.22–5.81)
RDW: 13.5 % (ref 11.5–15.5)
WBC: 5.3 10*3/uL (ref 4.0–10.5)
nRBC: 0 % (ref 0.0–0.2)

## 2019-11-04 LAB — SARS CORONAVIRUS 2 BY RT PCR (HOSPITAL ORDER, PERFORMED IN ~~LOC~~ HOSPITAL LAB): SARS Coronavirus 2: NEGATIVE

## 2019-11-04 LAB — HEMOGLOBIN A1C
Hgb A1c MFr Bld: 7.2 % — ABNORMAL HIGH (ref 4.8–5.6)
Mean Plasma Glucose: 159.94 mg/dL

## 2019-11-04 LAB — CREATININE, SERUM
Creatinine, Ser: 0.83 mg/dL (ref 0.61–1.24)
GFR calc Af Amer: >60 mL/min (ref >60)
GFR calc non Af Amer: >60 mL/min (ref >60)

## 2019-11-04 LAB — SEDIMENTATION RATE: Sed Rate: 7 mm/hr (ref 0–20)

## 2019-11-04 LAB — HIV ANTIBODY (ROUTINE TESTING W REFLEX): HIV Screen 4th Generation wRfx: NONREACTIVE

## 2019-11-04 LAB — C-REACTIVE PROTEIN: CRP: 0.6 mg/dL (ref <1.0)

## 2019-11-04 LAB — TSH: TSH: 3.12 u[IU]/mL (ref 0.350–4.500)

## 2019-11-04 MED ORDER — ENOXAPARIN SODIUM 40 MG/0.4ML ~~LOC~~ SOLN
40.0000 mg | SUBCUTANEOUS | Status: DC
  Filled 2019-11-04: qty 0.4

## 2019-11-04 MED ORDER — IOHEXOL 350 MG/ML SOLN
75.0000 mL | Freq: Once | INTRAVENOUS | Status: AC | PRN
  Administered 2019-11-04: 75 mL via INTRAVENOUS

## 2019-11-04 MED ORDER — ASPIRIN EC 81 MG PO TBEC
81.0000 mg | DELAYED_RELEASE_TABLET | Freq: Every day | ORAL | Status: DC
  Administered 2019-11-04 – 2019-11-05 (×2): 81 mg via ORAL
  Filled 2019-11-04 (×2): qty 1

## 2019-11-04 MED ORDER — ACETAMINOPHEN 325 MG PO TABS: 650.0000 mg | ORAL_TABLET | Freq: Four times a day (QID) | ORAL | Status: DC | PRN

## 2019-11-04 MED ORDER — LISINOPRIL 5 MG PO TABS
5.0000 mg | ORAL_TABLET | Freq: Every day | ORAL | Status: DC
  Administered 2019-11-04: 21:00:00 5 mg via ORAL
  Filled 2019-11-04: qty 1

## 2019-11-04 MED ORDER — CARVEDILOL 6.25 MG PO TABS: 3.1250 mg | ORAL_TABLET | Freq: Two times a day (BID) | ORAL | Status: DC

## 2019-11-04 MED ORDER — NITROGLYCERIN 0.4 MG SL SUBL: 0.4000 mg | SUBLINGUAL_TABLET | SUBLINGUAL | Status: DC | PRN

## 2019-11-04 MED ORDER — ATORVASTATIN CALCIUM 20 MG PO TABS
80.0000 mg | ORAL_TABLET | Freq: Every day | ORAL | Status: DC
  Administered 2019-11-04: 80 mg via ORAL
  Filled 2019-11-04: qty 4

## 2019-11-04 MED ORDER — INSULIN ASPART 100 UNIT/ML ~~LOC~~ SOLN
0.0000 [IU] | Freq: Three times a day (TID) | SUBCUTANEOUS | Status: DC
  Administered 2019-11-04: 5 [IU] via SUBCUTANEOUS
  Administered 2019-11-05: 2 [IU] via SUBCUTANEOUS
  Filled 2019-11-04 (×2): qty 1

## 2019-11-04 MED ORDER — INSULIN ASPART 100 UNIT/ML ~~LOC~~ SOLN: 0.0000 [IU] | Freq: Every day | SUBCUTANEOUS | Status: DC

## 2019-11-04 MED ORDER — ACETAMINOPHEN 650 MG RE SUPP: 650.0000 mg | Freq: Four times a day (QID) | RECTAL | Status: DC | PRN

## 2019-11-04 MED ORDER — CLOPIDOGREL BISULFATE 75 MG PO TABS
75.0000 mg | ORAL_TABLET | Freq: Every day | ORAL | Status: DC
  Administered 2019-11-04 – 2019-11-05 (×2): 75 mg via ORAL
  Filled 2019-11-04 (×2): qty 1

## 2019-11-04 NOTE — ED Notes (Signed)
Dietary staff gave pt his dinner tray.

## 2019-11-04 NOTE — ED Notes (Signed)
Pt to CT

## 2019-11-04 NOTE — Consult Note (Signed)
Requesting Physician: Agbata    Chief Complaint: Right vision loss  I have been asked by Dr. Francine Graven to see this patient in consultation for TIA.  HPI: Jeff Howard is an 59 y.o. male with medical history significant for coronary artery disease status post stent angioplasty on DAPT, history of diabetes mellitus and hypertension who presented to the emergency room for evaluation of episodic right eye vision loss.  Patient reported that his symptoms started about 11 AM on yesterday.  He felt like a black curtain was drawn over his right eye.  It was intermittent and lasted about 15 to 20 minutes with complete resolution.  He had about 4 episodes since the initial episode but currently vision is at baseline.  Patient on ASA and Plavix.  Date last known well: 11/03/2019 Time last known well: Time: 11:00 tPA Given: No: Resolution of symptoms  Past Medical History:  Diagnosis Date  . CAD (coronary artery disease)    a. 02/2015 NSTEMI/PCI: LM nl, LAD 14m(2.75x23 Xience/2.5x8 Xience DES), D2 20ost, LCX 63mRCA 3069mPDA1/2 40, EF 45-50.  . Diabetes mellitus without complication ([place] . Essential hypertension   . Hyperlipemia   . Ischemic cardiomyopathy    a. 02/2015 Echo: Ef 45-50%, mild apicalanterior HK, sev apical HK, triv MR.    Past Surgical History:  Procedure Laterality Date  . arm surgery     left  . CARDIAC CATHETERIZATION N/A 03/06/2015   Procedure: Left Heart Cath and Coronary Angiography;  Surgeon: MuhWellington HampshireD;  Location: [facility];  Service: Cardiovascular;  Laterality: N/A;  . CARDIAC CATHETERIZATION N/A 03/06/2015   Procedure: Coronary Stent Intervention;  Surgeon: MuhWellington HampshireD;  Location: [facility];  Service: Cardiovascular;  Laterality: N/A;    Family History  Problem Relation Age of Onset  . Heart attack Father   . Diabetes Other    Social History:  reports that he has never smoked. He has never used smokeless tobacco. He  reports that he does not drink alcohol or use drugs.  Allergies:  Allergies  Allergen Reactions  . Bee Venom Anaphylaxis  . Effient [Prasugrel]     Rash    Medications: I have reviewed the patient's current medications. Prior to Admission medications   Medication Sig Start Date End Date Taking? Authorizing Provider  aspirin 81 MG tablet Take 81 mg by mouth daily.   Yes [provider]  atorvastatin (LIPITOR) 80 MG tablet Take 1 tablet (80 mg total) by mouth daily at 6 PM. 02/04/19  Yes Arida, MuhMertie ClauseD  carvedilol (COREG) 3.125 MG tablet Take 1 tablet (3.125 mg total) by mouth 2 (two) times daily. 02/04/19 11/04/19 Yes AriWellington HampshireD  clopidogrel (PLAVIX) 75 MG tablet Take 1 tablet (75 mg total) by mouth daily. 02/04/19  Yes AriWellington HampshireD  EPINEPHrine 0.3 mg/0.3 mL IJ SOAJ injection Inject 0.3 mLs (0.3 mg total) into the muscle once. Follow package instructions as needed for severe allergy or anaphylactic reaction. 12/22/15  Yes StaCarrie MewD  lisinopril (ZESTRIL) 5 MG tablet Take 1 tablet (5 mg total) by mouth daily. Patient taking differently: Take 5 mg by mouth at bedtime.  02/04/19  Yes AriWellington HampshireD  metFORMIN (GLUCOPHAGE-XR) 500 MG 24 hr tablet Take 500 mg by mouth daily with lunch.    Yes [provider]  nitroGLYCERIN (NITROSTAT) 0.4 MG SL tablet Place 1 tablet (0.4 mg total) under the tongue  every 5 (five) minutes as needed for chest pain. 03/24/15  Yes Theora Gianotti, NP  pioglitazone (ACTOS) 45 MG tablet Take 45 mg by mouth at bedtime.    Yes [provider]    ROS: History obtained from the patient  General ROS: negative for - chills, fatigue, fever, night sweats, weight gain or weight loss Psychological ROS: negative for - behavioral disorder, hallucinations, memory difficulties, mood swings or suicidal ideation Ophthalmic ROS: negative for - blurry vision, double vision, eye pain or loss of vision ENT  ROS: negative for - epistaxis, nasal discharge, oral lesions, sore throat, tinnitus or vertigo Allergy and Immunology ROS: negative for - hives or itchy/watery eyes Hematological and Lymphatic ROS: negative for - bleeding problems, bruising or swollen lymph nodes Endocrine ROS: negative for - galactorrhea, hair pattern changes, polydipsia/polyuria or temperature intolerance Respiratory ROS: negative for - cough, hemoptysis, shortness of breath or wheezing Cardiovascular ROS: negative for - chest pain, dyspnea on exertion, edema or irregular heartbeat Gastrointestinal ROS: negative for - abdominal pain, diarrhea, hematemesis, nausea/vomiting or stool incontinence Genito-Urinary ROS: negative for - dysuria, hematuria, incontinence or urinary frequency/urgency Musculoskeletal ROS: negative for - joint swelling or muscular weakness Neurological ROS: as noted in HPI Dermatological ROS: negative for rash and skin lesion changes  Physical Examination: Blood pressure 122/71, pulse (!) 48, temperature 98.3 F (36.8 C), temperature source Oral, resp. rate 18, height _0  (1.6 m), weight 78.5 kg, SpO2 99 %.  HEENT-  Normocephalic, no lesions, without obvious abnormality.  Normal external eye and conjunctiva.  Normal TM's bilaterally.  Normal auditory canals and external ears. Normal external nose, mucus membranes and septum.  Normal pharynx. Cardiovascular- S1, S2 normal, pulses palpable throughout   Lungs- chest clear, no wheezing, rales, normal symmetric air entry Abdomen- soft, non-tender; bowel sounds normal; no masses,  no organomegaly Extremities- no edema Lymph-no adenopathy palpable Musculoskeletal-no joint tenderness, deformity or swelling Skin-warm and dry, no hyperpigmentation, vitiligo, or suspicious lesions  Neurological Examination   Mental Status: Alert, oriented, thought content appropriate.  Speech fluent without evidence of aphasia.  Able to follow 3 step commands without  difficulty. Cranial Nerves: II: Discs flat bilaterally; Visual fields grossly normal, pupils equal, round, reactive to light and accommodation III,IV, VI: ptosis not present, extra-ocular motions intact bilaterally V,VII: smile symmetric, facial light touch sensation normal bilaterally VIII: hearing normal bilaterally IX,X: gag reflex present XI: bilateral shoulder shrug XII: midline tongue extension Motor: Right : Upper extremity   5/5    Left:     Upper extremity   5/5  Lower extremity   5/5     Lower extremity   5/5 Tone and bulk:normal tone throughout; no atrophy noted Sensory: Pinprick and light touch intact throughout, bilaterally Deep Tendon Reflexes: Symmetric throughout Plantars: Right: mute   Left: mute Cerebellar: Normal finger-to-nose and normal heel-to-shin testing bilaterally Gait: not tested due to safety concerns    Laboratory Studies:  Basic Metabolic Panel: Recent Labs  Lab 11/03/19 1955 11/04/19 0544  NA 139  --   K 4.3  --   CL 105  --   CO2 27  --   GLUCOSE 112*  --   BUN 23  --   CREATININE 0.94 0.83  CALCIUM 9.0  --     Liver Function Tests: Recent Labs  Lab 11/03/19 1955  AST 20  ALT 24  ALKPHOS 66  BILITOT 0.8  PROT 7.0  ALBUMIN 4.1   No results for input(s): LIPASE, AMYLASE in  the last 168 hours. No results for input(s): AMMONIA in the last 168 hours.  CBC: Recent Labs  Lab 11/03/19 1955 11/04/19 0544  WBC 5.7 5.3  NEUTROABS 2.8  --   HGB 13.8 13.6  HCT 41.5 40.1  MCV 87.9 85.9  PLT 196 190    Cardiac Enzymes: No results for input(s): CKTOTAL, CKMB, CKMBINDEX, TROPONINI in the last 168 hours.  BNP: Invalid input(s): POCBNP  CBG: Recent Labs  Lab 11/04/19 0903 11/04/19 1203  GLUCAP 106* 202*    Microbiology: Results for orders placed or performed during the hospital encounter of 11/04/19  SARS Coronavirus 2 by RT PCR (hospital order, performed in [facility] hospital lab) Nasopharyngeal Nasopharyngeal Swab      Status: None   Collection Time: 11/04/19  4:28 AM   Specimen: Nasopharyngeal Swab  Result Value Ref Range Status   SARS Coronavirus 2 NEGATIVE NEGATIVE Final    Comment: (NOTE) SARS-CoV-2 target nucleic acids are NOT DETECTED. The SARS-CoV-2 RNA is generally detectable in upper and lower respiratory specimens during the acute phase of infection. The lowest concentration of SARS-CoV-2 viral copies this assay can detect is 250 copies / mL. A negative result does not preclude SARS-CoV-2 infection and should not be used as the sole basis for treatment or other patient management decisions.  A negative result may occur with improper specimen collection / handling, submission of specimen other than nasopharyngeal swab, presence of viral mutation(s) within the areas targeted by this assay, and inadequate number of viral copies (<250 copies / mL). A negative result must be combined with clinical observations, patient history, and epidemiological information. Fact Sheet for Patients:   StrictlyIdeas.no Fact Sheet for Healthcare Providers: BankingDealers.co.za This test is not yet approved or cleared  by the [place] FDA and has been authorized for detection and/or diagnosis of SARS-CoV-2 by FDA under an Emergency Use Authorization (EUA).  This EUA will remain in effect (meaning this test can be used) for the duration of the COVID-19 declaration under Section 564(b)(1) of the Act, 21 U.S.C. section 360bbb-3(b)(1), unless the authorization is terminated or revoked sooner. Performed at [facility], [address] [place], [place] [address]     Coagulation Studies: Recent Labs    11/03/19 1955  LABPROT 12.6  INR 1.0    Urinalysis: No results for input(s): COLORURINE, LABSPEC, PHURINE, GLUCOSEU, HGBUR, BILIRUBINUR, KETONESUR, PROTEINUR, UROBILINOGEN, NITRITE, LEUKOCYTESUR in the last 168 hours.  Invalid input(s):  APPERANCEUR  Lipid Panel:    Component Value Date/Time   CHOL 140 03/06/2015 0011   TRIG 60 03/06/2015 0011   HDL 52 03/06/2015 0011   CHOLHDL 2.7 03/06/2015 0011   VLDL 12 03/06/2015 0011   LDLCALC 76 03/06/2015 0011    HgbA1C:  Lab Results  Component Value Date   HGBA1C 7.2 (H) 11/04/2019    Urine Drug Screen:  No results found for: LABOPIA, COCAINSCRNUR, LABBENZ, AMPHETMU, THCU, LABBARB  Alcohol Level: No results for input(s): ETH in the last 168 hours.  Other results: EKG: sinus bradycardia at 50 bpm.  Imaging: CT Angio Head W or Wo Contrast  Result Date: 11/04/2019 CLINICAL DATA:  Monocular vision loss on the right that is recurring today. EXAM: CT ANGIOGRAPHY HEAD AND NECK TECHNIQUE: Multidetector CT imaging of the head and neck was performed using the standard protocol during bolus administration of intravenous contrast. Multiplanar CT image reconstructions and MIPs were obtained to evaluate the vascular anatomy. Carotid stenosis measurements (when applicable) are obtained utilizing NASCET criteria, using  the distal internal carotid diameter as the denominator. CONTRAST:  60m OMNIPAQUE IOHEXOL 350 MG/ML SOLN COMPARISON:  Brain MRI from earlier today FINDINGS: CT HEAD FINDINGS Brain: No evidence of acute infarction, hemorrhage, hydrocephalus, extra-axial collection or mass lesion/mass effect. Vascular: See below Skull: Normal. Negative for fracture or focal lesion. Sinuses: Negative Orbits: Negative Review of the MIP images confirms the above findings CTA NECK FINDINGS Aortic arch: Normal with 3 vessel branching. Right carotid system: Low-density plaque at the distal common carotid artery. Minimal plaque at the ICA bifurcation. No stenosis or dissection seen. Left carotid system: Mild calcified plaque at the bifurcation. No stenosis or ulceration. Vertebral arteries: Proximal subclavian atherosclerosis. Mild calcified plaque at the proximal right vertebral artery. High-grade  narrowing at the left vertebral origin to the degree that luminal measurement is limited. No dissection or beading. Skeleton: Negative Other neck: Negative Upper chest: Clear apical lungs Review of the MIP images confirms the above findings CTA HEAD FINDINGS Anterior circulation: Calcified plaque on the bilateral carotid siphons. No branch occlusion, beading, or aneurysm. Both ophthalmic arteries are symmetrically opacified. Posterior circulation: The vertebral and basilar arteries are widely patent. No branch occlusion, beading, or aneurysm. Venous sinuses: Patent Anatomic variants: None significant Review of the MIP images confirms the above findings IMPRESSION: 1. No emergent finding. No flow limiting stenosis or ulceration seen in the right carotid. 2. Overall mild atherosclerosis in the head and neck, but there is a notable high-grade narrowing at the left vertebral origin. Electronically Signed   By: JMonte FantasiaM.D.   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 COMPARISON:  None. FINDINGS: Brain: No acute infarct or hemorrhage. Lateral ventricles and midline structures are unremarkable. No acute extra-axial fluid collections. No mass effect. Vascular: No hyperdense vessel or unexpected calcification. Skull: Normal. Negative for fracture or focal lesion. Sinuses/Orbits: No acute finding. Other: None. IMPRESSION: 1. No acute intracranial process. Electronically Signed   By: MRanda NgoM.D.   On: 11/03/2019 20:10   CT Angio Neck W and/or Wo Contrast  Result Date: 11/04/2019 CLINICAL DATA:  Monocular vision loss on the right that is recurring today. EXAM: CT ANGIOGRAPHY HEAD AND NECK TECHNIQUE: Multidetector CT imaging of the head and neck was performed using  the standard protocol during bolus administration of intravenous contrast. Multiplanar CT image reconstructions and MIPs were obtained to evaluate the vascular anatomy. Carotid stenosis measurements (when applicable) are obtained utilizing NASCET criteria, using the distal internal carotid diameter as the denominator. CONTRAST:  755mOMNIPAQUE IOHEXOL 350 MG/ML SOLN COMPARISON:  Brain MRI from earlier today FINDINGS: CT HEAD FINDINGS Brain: No evidence of acute infarction, hemorrhage, hydrocephalus, extra-axial collection or mass lesion/mass effect. Vascular: See below Skull: Normal. Negative for fracture or focal lesion. Sinuses: Negative Orbits: Negative Review of the MIP images confirms the above findings CTA NECK FINDINGS Aortic arch: Normal with 3 vessel branching. Right carotid system: Low-density plaque at the distal common carotid artery. Minimal plaque at the ICA bifurcation. No stenosis or dissection seen. Left carotid system: Mild calcified plaque at the bifurcation. No stenosis or ulceration. Vertebral arteries: Proximal subclavian atherosclerosis. Mild calcified plaque at the proximal right vertebral artery. High-grade narrowing at the left vertebral origin to the degree that luminal measurement is limited. No dissection or beading.  Skeleton: Negative Other neck: Negative Upper chest: Clear apical lungs Review of the MIP images confirms the above findings CTA HEAD FINDINGS Anterior circulation: Calcified plaque on the bilateral carotid siphons. No branch occlusion, beading, or aneurysm. Both ophthalmic arteries are symmetrically opacified. Posterior circulation: The vertebral and basilar arteries are widely patent. No branch occlusion, beading, or aneurysm. Venous sinuses: Patent Anatomic variants: None significant Review of the MIP images confirms the above findings IMPRESSION: 1. No emergent finding. No flow limiting stenosis or ulceration seen in the right carotid. 2. Overall mild atherosclerosis in  the head and neck, but there is a notable high-grade narrowing at the left vertebral origin. Electronically Signed   By: Monte Fantasia M.D.   On: 11/04/2019 05:27   MR BRAIN WO CONTRAST  Result Date: 11/04/2019 CLINICAL DATA:  Sudden onset right eye blindness EXAM: MRI HEAD WITHOUT CONTRAST TECHNIQUE: Multiplanar, multiecho pulse sequences of the brain and surrounding structures were obtained without intravenous contrast. COMPARISON:  None. FINDINGS: BRAIN: No acute infarct, acute hemorrhage or extra-axial collection. Normal white matter signal. Normal volume of CSF spaces. No chronic microhemorrhage. Normal midline structures. VASCULAR: Major flow voids are preserved. SKULL AND UPPER CERVICAL SPINE: Normal calvarium and skull base. Visualized upper cervical spine and soft tissues are normal. SINUSES/ORBITS: No paranasal sinus fluid levels or advanced mucosal thickening. No mastoid or middle ear effusion. Normal orbits. IMPRESSION: Normal MRI of the brain. Electronically Signed   By: Ulyses Jarred M.D.   On: 11/04/2019 01:23    Assessment: 59 y.o. 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  MRI of the brain personally reviewed and shows no acute changes.  CTA of the head and neck reviewed and shows no evidence of LVO.  Patient on ASA and Plavix prior to admission.  Loaded with high dose ASA and Plavix in the ED.  Has had no further events since admission.   CRP 0.6, ESR 7, A1c 7.2   Stroke Risk Factors - diabetes mellitus and hypertension  Plan: 1. Fasting lipid panel.  Target LDL<70.   2. PT consult, OT consult, Speech consult 3. Liberal blood pressure management to encourage cerebral perfusion 4. Prophylactic therapy-Continue ASA and Plavix 5. Telemetry monitoring 6.  Frequent neuro checks 7. Echocardiogram with bubble study   Alexis Goodell, MD Neurology [phone] 11/04/2019, 4:15 PM

## 2019-11-04 NOTE — ED Notes (Signed)
Pt alert and resting calmly in bed. Family remains at bedside.

## 2019-11-04 NOTE — ED Provider Notes (Signed)
[facility] Emergency Department Provider Note  ____________________________________________   First MD Initiated Contact with Patient 11/04/19 [phone]     (approximate)  I have reviewed the triage vital signs and the nursing notes.   HISTORY  Chief Complaint Loss of Vision  The patient and/or family speak(s) Spanish.  They understand they have the right to the use of a hospital interpreter.  I utilized hospital interpreter services, as well as personally speaking with the patient and his wife in both [place] and [place].   HPI Jeff Howard is a 59 y.o. male his medical history is most notable for coronary artery disease with prior MI and stent on dual antiplatelet therapy (aspirin and Plavix).  He presents for evaluation of episodic right eye vision loss.  He reports it started around 11 AM yesterday (more than 12 hours ago) and he said it felt like a black curtain being drawn down over his right vision in the right eye.  It lasted 15 to 20 minutes and then his vision came back.  This repeated about 4 or 5 times over the course of the day.  And last happened a few hours ago.  His vision is currently normal.  There was no pain associated with the vision loss.  He has no headache or neck pain, although recently he had a fall with some pain in his shoulder that radiated up into his neck.  His wife reports that a few days ago he went to a masseuse who gave him a massage of his shoulders and his neck.  He denies fever/chills, headache including of the temples, sore throat, chest pain, shortness of breath, nausea, vomiting, abdominal pain, and dysuria.    The onset is acute and the vision loss is severe but then it resolves and nothing in particular makes it better or worse.  He has never had anything like this happen.  He does not use corrective lenses and he goes for checkups at the [facility] but cannot remember the name of his ophthalmologist.          Past Medical History:  Diagnosis Date  . CAD (coronary artery disease)    a. 02/2015 NSTEMI/PCI: LM nl, LAD 92m (2.75x23 Xience/2.5x8 Xience DES), D2 20ost, LCX 51m, RCA 60m, RPDA1/2 40, EF 45-50.  . Diabetes mellitus without complication ([place])   . Essential hypertension   . Hyperlipemia   . Ischemic cardiomyopathy    a. 02/2015 Echo: Ef 45-50%, mild apicalanterior HK, sev apical HK, triv MR.    Patient Active Problem List   Diagnosis Date Noted  . CAD (coronary artery disease)   . Hyperlipemia   . Hypertension   . Diabetes mellitus without complication ([place])   . Ischemic cardiomyopathy   . Pain in the chest   . Angina pectoris ([place])   . Poorly controlled type 2 diabetes mellitus with circulatory disorder ([place])   . Hyperlipidemia   . NSTEMI (non-ST elevated myocardial infarction) ([place]) 03/05/2015    Past Surgical History:  Procedure Laterality Date  . arm surgery     left  . CARDIAC CATHETERIZATION N/A 03/06/2015   Procedure: Left Heart Cath and Coronary Angiography;  Surgeon: Wellington Hampshire, MD;  Location: [facility];  Service: Cardiovascular;  Laterality: N/A;  . CARDIAC CATHETERIZATION N/A 03/06/2015   Procedure: Coronary Stent Intervention;  Surgeon: Wellington Hampshire, MD;  Location: [facility];  Service: Cardiovascular;  Laterality: N/A;    Prior to  Admission medications   Medication Sig Start Date End Date Taking? Authorizing Provider  aspirin 81 MG tablet Take 81 mg by mouth daily.    [provider]  atorvastatin (LIPITOR) 80 MG tablet Take 1 tablet (80 mg total) by mouth daily at 6 PM. 02/04/19   Iran Ouch, MD  carvedilol (COREG) 3.125 MG tablet Take 1 tablet (3.125 mg total) by mouth 2 (two) times daily. 02/04/19 05/05/19  Iran Ouch, MD  clopidogrel (PLAVIX) 75 MG tablet Take 1 tablet (75 mg total) by mouth daily. 02/04/19   Iran Ouch, MD  EPINEPHrine 0.3 mg/0.3 mL IJ SOAJ injection Inject 0.3 mLs (0.3 mg  total) into the muscle once. Follow package instructions as needed for severe allergy or anaphylactic reaction. 12/22/15   Sharman Cheek, MD  lisinopril (ZESTRIL) 5 MG tablet Take 1 tablet (5 mg total) by mouth daily. 02/04/19   Iran Ouch, MD  metFORMIN (GLUCOPHAGE-XR) 500 MG 24 hr tablet Take 500 mg by mouth daily after supper.     [provider]  nitroGLYCERIN (NITROSTAT) 0.4 MG SL tablet Place 1 tablet (0.4 mg total) under the tongue every 5 (five) minutes as needed for chest pain. 03/24/15   Creig Hines, NP  pioglitazone (ACTOS) 45 MG tablet Take 45 mg by mouth daily.    [provider]    Allergies Effient [prasugrel]  Family History  Problem Relation Age of Onset  . Heart attack Father   . Diabetes Other     Social History Social History   Tobacco Use  . Smoking status: Never Smoker  . Smokeless tobacco: Never Used  Substance Use Topics  . Alcohol use: No  . Drug use: No    Review of Systems Constitutional: No fever/chills Eyes: Episodic right eye vision loss, lasting 15 to 20 minutes at a time then happening for 5 times over the course of the last 24 hours. ENT: No sore throat. Cardiovascular: Denies chest pain. Respiratory: Denies shortness of breath. Gastrointestinal: No abdominal pain.  No nausea, no vomiting.  No diarrhea.  No constipation. Genitourinary: Negative for dysuria. Musculoskeletal: Negative for neck pain.  Negative for back pain. Integumentary: Negative for rash. Neurological: Negative for headaches, focal weakness or numbness.   ____________________________________________   PHYSICAL EXAM:  VITAL SIGNS: ED Triage Vitals  Enc Vitals Group     BP 11/03/19 1931 (!) 145/84     Pulse Rate 11/03/19 1931 (!) 55     Resp 11/03/19 1931 18     Temp 11/03/19 1931 98.3 F (36.8 C)     Temp Source 11/03/19 1931 Oral     SpO2 11/03/19 1931 98 %     Weight 11/03/19 1936 78.5 kg (173 lb)     Height 11/03/19  1936 1.6 m (5\' 3" )     Head Circumference --      Peak Flow --      Pain Score 11/03/19 1932 0     Pain Loc --      Pain Edu? --      Excl. in GC? --     Constitutional: Alert and oriented.  Eyes: Conjunctivae are normal.  Pupils are equal and reactive.  Extraocular motion is intact.  Vision is normal at this time.  Pressures are within normal limits:  11 mmHg OD, 14 mmHg OS.  Funduscopic exam is limited by equipment and likely by technique; in spite of using both a panoptic and regular ophthalmoscope, I have difficulty visualizing  the fundus. Head: Atraumatic.  Patient has no tenderness to palpation of the temples. Nose: No congestion/rhinnorhea. Mouth/Throat: Patient is wearing a mask. Neck: No stridor.  No meningeal signs.   Cardiovascular: Normal rate, regular rhythm. Good peripheral circulation. Grossly normal heart sounds. Respiratory: Normal respiratory effort.  No retractions. Gastrointestinal: Soft and nontender. No distention.  Musculoskeletal: No lower extremity tenderness nor edema. No gross deformities of extremities. Neurologic:  Normal speech and language. No gross focal neurologic deficits are appreciated.  Skin:  Skin is warm, dry and intact. Psychiatric: Mood and affect are normal. Speech and behavior are normal.  ____________________________________________   LABS (all labs ordered are listed, but only abnormal results are displayed)  Labs Reviewed  COMPREHENSIVE METABOLIC PANEL - Abnormal; Notable for the following components:      Result Value   Glucose, Bld 112 (*)    All other components within normal limits  SARS CORONAVIRUS 2 BY RT PCR (HOSPITAL ORDER, PERFORMED IN [facility])  PROTIME-INR  APTT  CBC  DIFFERENTIAL  SEDIMENTATION RATE  C-REACTIVE PROTEIN   ____________________________________________  EKG  ED ECG REPORT I, Loleta Rose, the attending physician, personally viewed and interpreted this ECG.  Date: 11/03/2019 EKG  Time: 21:18 Rate: 50 Rhythm: sinus bradycardia QRS Axis: normal Intervals: normal ST/T Wave abnormalities: No obvious ST/T wave changes Narrative Interpretation: no evidence of acute ischemia; does not meet STEMI criteria.   ____________________________________________  RADIOLOGY I, Loleta Rose, personally viewed and evaluated these images (plain radiographs) as part of my medical decision making, as well as reviewing the written report by the radiologist.  ED MD interpretation: No acute abnormalities identified on CT head nor MR brain.  CTA head/neck pending at the time of admission.  Official radiology report(s): CT HEAD WO CONTRAST  Result Date: 11/03/2019 CLINICAL DATA:  Sudden blindness right eye since this morning EXAM: CT HEAD WITHOUT CONTRAST TECHNIQUE: Contiguous axial images were obtained from the base of the skull through the vertex without intravenous contrast. COMPARISON:  None. FINDINGS: Brain: No acute infarct or hemorrhage. Lateral ventricles and midline structures are unremarkable. No acute extra-axial fluid collections. No mass effect. Vascular: No hyperdense vessel or unexpected calcification. Skull: Normal. Negative for fracture or focal lesion. Sinuses/Orbits: No acute finding. Other: None. IMPRESSION: 1. No acute intracranial process. Electronically Signed   By: Sharlet Salina M.D.   On: 11/03/2019 20:10    MR BRAIN WO CONTRAST  Result Date: 11/04/2019 CLINICAL DATA:  Sudden onset right eye blindness EXAM: MRI HEAD WITHOUT CONTRAST TECHNIQUE: Multiplanar, multiecho pulse sequences of the brain and surrounding structures were obtained without intravenous contrast. COMPARISON:  None. FINDINGS: BRAIN: No acute infarct, acute hemorrhage or extra-axial collection. Normal white matter signal. Normal volume of CSF spaces. No chronic microhemorrhage. Normal midline structures. VASCULAR: Major flow voids are preserved. SKULL AND UPPER CERVICAL SPINE: Normal calvarium and skull  base. Visualized upper cervical spine and soft tissues are normal. SINUSES/ORBITS: No paranasal sinus fluid levels or advanced mucosal thickening. No mastoid or middle ear effusion. Normal orbits. IMPRESSION: Normal MRI of the brain. Electronically Signed   By: Deatra Robinson M.D.   On: 11/04/2019 01:23    ____________________________________________   PROCEDURES   Procedure(s) performed (including Critical Care):  Procedures   ____________________________________________   INITIAL IMPRESSION / MDM / ASSESSMENT AND PLAN / ED COURSE  As part of my medical decision making, I reviewed the following data within the electronic MEDICAL RECORD NUMBER History obtained from family, Nursing notes reviewed and  incorporated, Interpreter needed, Labs reviewed , EKG interpreted , Discussed with admitting physician (Dr. Para March), Discussed with ophthalmologist (Dr. Lara Mulch), and reviewed Notes from prior ED visits   Differential diagnosis includes, but is not limited to, amaurosis fugax, central retinal venous occlusion, central retinal arterial occlusion, CVA/TIA, temporal arteritis.  Vital signs are stable, lab work is all within normal limits including metabolic panel, coagulation studies, and CBC.  I added on sedimentation rate and CRP.  CT head is within normal limits and MR brain is within normal limits showing no sign of CVA or acute bleed.  Physical exam is reassuring although funduscopic exam is limited as documented above.  Intraocular pressures are within normal limits.  Patient currently has no vision loss.  Symptoms are most concerning for amaurosis fugax and I am concerned about a carotid/cardiogenic source given his known coronary artery disease.  He also recently had a massage that included the neck and I wonder if a plaque could have broken off and he could be having small thrombi that are passing up to his eye.  I discussed the case by phone with Dr. Lara Mulch with ophthalmology.  He agreed this  sounds like amaurosis fugax.  We discussed various options for admission versus outpatient follow-up.  He agreed with plan for CTA head and neck.  I reviewed the current emergency medicine recommendations and the recommendation is for admission.  I discussed this with the wife and the patient and they both agreed to stay.  I then consulted the hospitalist for admission and discussed the case by phone with Dr. Para March who will admit the patient, follow-up on CTA imaging, and likely consult ophthalmology and/or cardiology, and vascular surgery if needed.         Clinical Course as of Nov 03 605  Thu Nov 04, 2019  [id number] Discussed by phone with Dr. Para March who will admit.   [CF]    Clinical Course User Index [CF] Loleta Rose, MD     ____________________________________________  FINAL CLINICAL IMPRESSION(S) / ED DIAGNOSES  Final diagnoses:  Amaurosis fugax of right eye     MEDICATIONS GIVEN DURING THIS VISIT:  Medications  iohexol (OMNIPAQUE) 350 MG/ML injection 75 mL (75 mLs Intravenous Contrast Given 11/04/19 0432)     ED Discharge Orders    None      *Please note:  Karson Reede was evaluated in Emergency Department on 11/04/2019 for the symptoms described in the history of present illness. He was evaluated in the context of the global COVID-19 pandemic, which necessitated consideration that the patient might be at risk for infection with the SARS-CoV-2 virus that causes COVID-19. Institutional protocols and algorithms that pertain to the evaluation of patients at risk for COVID-19 are in a state of rapid change based on information released by regulatory bodies including the CDC and federal and state organizations. These policies and algorithms were followed during the patient's care in the ED.  Some ED evaluations and interventions may be delayed as a result of limited staffing during the pandemic.*  Note:  This document was prepared using Dragon voice recognition  software and may include unintentional dictation errors.   Loleta Rose, MD 11/04/19 [phone]

## 2019-11-04 NOTE — ED Notes (Signed)
Pt given breakfast tray

## 2019-11-04 NOTE — ED Notes (Signed)
cbg 110  

## 2019-11-04 NOTE — ED Notes (Signed)
Pt in with co intermittent episodes of blurry vision to right eye that started yesterday, no hx of the same. Denies any injuries or possible causes. States has not had episode of the same since yesterday. States normally does not wear corrective lenses. Pt denies any pain or other deficits.

## 2019-11-04 NOTE — ED Notes (Signed)
Pt to MRI via w/c accomp by MRI tech

## 2019-11-04 NOTE — ED Notes (Addendum)
Pt speaks English well; denies need for interpreter. Family at bedside. Pt A&Ox4. Denies HA or any other pain. Bed locked low. Rails up. Call bell within reach. Pt offered food but stated he is "not hungry".

## 2019-11-04 NOTE — H&P (Addendum)
History and Physical    Dick Hark EHU:[id number] DOB: 1960-09-26 DOA: 11/04/2019  PCP: Cecile Sheerer, MD   Patient coming from: Home  I have personally briefly reviewed patient's old medical records in [place]  Chief Complaint: Vision loss right eye  HPI: Jeff Howard is a 59 y.o. male with medical history significant for coronary artery disease status post stent angioplasty on DAPT, history of diabetes mellitus and hypertension who presented to the emergency room for evaluation of episodic right eye vision loss.  Patient reported that his symptoms started about 11 AM and felt like a black curtain was drawn over his right eye.  It was intermittent and lasted about 15 to 20 minutes with complete resolution.  He had about 4-5 episodes over the course of the day but at the time of this history and physical his vision is back to normal.  He denies having any headache or neck pain but states that he recently had a massage of his shoulders and neck. He denies having any headaches, no fever, no chills, no chest pain, no shortness of breath, no nausea, no vomiting, no abdominal pain or any urinary symptoms. He had a CT scan of the head done without contrast and an MRI of the brain which did not show any acute findings. CTA shows mild atherosclerotic disease.  ED Course: Patient seen in the emergency room for evaluation of right-sided visual loss which started at about 11 AM.  Patient presented to the ER about 12 hours later.  Symptoms have resolved.  He will be referred to observation for further evaluation.  Review of Systems: As per HPI otherwise 10 point review of systems negative.    Past Medical History:  Diagnosis Date  . CAD (coronary artery disease)    a. 02/2015 NSTEMI/PCI: LM nl, LAD 78m (2.75x23 Xience/2.5x8 Xience DES), D2 20ost, LCX 52m, RCA 32m, RPDA1/2 40, EF 45-50.  . Diabetes mellitus without complication ([place])   . Essential hypertension     . Hyperlipemia   . Ischemic cardiomyopathy    a. 02/2015 Echo: Ef 45-50%, mild apicalanterior HK, sev apical HK, triv MR.    Past Surgical History:  Procedure Laterality Date  . arm surgery     left  . CARDIAC CATHETERIZATION N/A 03/06/2015   Procedure: Left Heart Cath and Coronary Angiography;  Surgeon: Wellington Hampshire, MD;  Location: [facility];  Service: Cardiovascular;  Laterality: N/A;  . CARDIAC CATHETERIZATION N/A 03/06/2015   Procedure: Coronary Stent Intervention;  Surgeon: Wellington Hampshire, MD;  Location: [facility];  Service: Cardiovascular;  Laterality: N/A;     reports that he has never smoked. He has never used smokeless tobacco. He reports that he does not drink alcohol or use drugs.  Allergies  Allergen Reactions  . Bee Venom Anaphylaxis  . Effient [Prasugrel]     Rash    Family History  Problem Relation Age of Onset  . Heart attack Father   . Diabetes Other      Prior to Admission medications   Medication Sig Start Date End Date Taking? Authorizing Provider  aspirin 81 MG tablet Take 81 mg by mouth daily.   Yes [provider]  atorvastatin (LIPITOR) 80 MG tablet Take 1 tablet (80 mg total) by mouth daily at 6 PM. 02/04/19  Yes Arida, Mertie Clause, MD  carvedilol (COREG) 3.125 MG tablet Take 1 tablet (3.125 mg total) by mouth 2 (two) times daily. 02/04/19 11/04/19  Yes Iran OuchArida, Muhammad A, MD  clopidogrel (PLAVIX) 75 MG tablet Take 1 tablet (75 mg total) by mouth daily. 02/04/19  Yes [place] OuchArida, Muhammad A, MD  EPINEPHrine 0.3 mg/0.3 mL IJ SOAJ injection Inject 0.3 mLs (0.3 mg total) into the muscle once. Follow package instructions as needed for severe allergy or anaphylactic reaction. 12/22/15  Yes Sharman CheekStafford, Phillip, MD  lisinopril (ZESTRIL) 5 MG tablet Take 1 tablet (5 mg total) by mouth daily. Patient taking differently: Take 5 mg by mouth at bedtime.  02/04/19  Yes Iran OuchArida, Muhammad A, MD  metFORMIN (GLUCOPHAGE-XR) 500 MG 24 hr tablet Take 500  mg by mouth daily with lunch.    Yes [provider]  nitroGLYCERIN (NITROSTAT) 0.4 MG SL tablet Place 1 tablet (0.4 mg total) under the tongue every 5 (five) minutes as needed for chest pain. 03/24/15  Yes Creig HinesBerge, Christopher Ronald, NP  pioglitazone (ACTOS) 45 MG tablet Take 45 mg by mouth at bedtime.    Yes [provider]    Physical Exam: Vitals:   11/03/19 1936 11/04/19 0400 11/04/19 0427 11/04/19 0600  BP:  (!) 149/80 (!) 149/80 122/71  Pulse:  (!) 43 (!) 57 (!) 48  Resp:   18   Temp:      TempSrc:      SpO2:  99% 99% 99%  Weight: 78.5 kg     Height: 5\' 3"  (1.6 m)        Vitals:   11/03/19 1936 11/04/19 0400 11/04/19 0427 11/04/19 0600  BP:  (!) 149/80 (!) 149/80 122/71  Pulse:  (!) 43 (!) 57 (!) 48  Resp:   18   Temp:      TempSrc:      SpO2:  99% 99% 99%  Weight: 78.5 kg     Height: 5\' 3"  (1.6 m)       Constitutional: NAD, alert and oriented x 3 Eyes: PERRL, lids and conjunctivae normal ENMT: Mucous membranes are moist.  Neck: normal, supple, no masses, no thyromegaly Respiratory: clear to auscultation bilaterally, no wheezing, no crackles. Normal respiratory effort. No accessory muscle use.  Cardiovascular: Bradycardia, no murmurs / rubs / gallops. No extremity edema. 2+ pedal pulses. No carotid bruits.  Abdomen: no tenderness, no masses palpated. No hepatosplenomegaly. Bowel sounds positive.  Musculoskeletal: no clubbing / cyanosis. No joint deformity upper and lower extremities.  Skin: no rashes, lesions, ulcers.  Neurologic: No gross focal neurologic deficit. Psychiatric: Normal mood and affect.   Labs on Admission: I have personally reviewed following labs and imaging studies  CBC: Recent Labs  Lab 11/03/19 1955 11/04/19 0544  WBC 5.7 5.3  NEUTROABS 2.8  --   HGB 13.8 13.6  HCT 41.5 40.1  MCV 87.9 85.9  PLT 196 190   Basic Metabolic Panel: Recent Labs  Lab 11/03/19 1955 11/04/19 0544  NA 139  --   K 4.3  --   CL 105  --     CO2 27  --   GLUCOSE 112*  --   BUN 23  --   CREATININE 0.94 0.83  CALCIUM 9.0  --    GFR: Estimated Creatinine Clearance: 86.6 mL/min (by C-G formula based on SCr of 0.83 mg/dL). Liver Function Tests: Recent Labs  Lab 11/03/19 1955  AST 20  ALT 24  ALKPHOS 66  BILITOT 0.8  PROT 7.0  ALBUMIN 4.1   No results for input(s): LIPASE, AMYLASE in the last 168 hours. No results for input(s): AMMONIA in the last 168 hours.  Coagulation Profile: Recent Labs  Lab 11/03/19 1955  INR 1.0   Cardiac Enzymes: No results for input(s): CKTOTAL, CKMB, CKMBINDEX, TROPONINI in the last 168 hours. BNP (last 3 results) No results for input(s): PROBNP in the last 8760 hours. HbA1C: Recent Labs    11/04/19 0544  HGBA1C 7.2*   CBG: Recent Labs  Lab 11/04/19 0903  GLUCAP 106*   Lipid Profile: No results for input(s): CHOL, HDL, LDLCALC, TRIG, CHOLHDL, LDLDIRECT in the last 72 hours. Thyroid Function Tests: No results for input(s): TSH, T4TOTAL, FREET4, T3FREE, THYROIDAB in the last 72 hours. Anemia Panel: No results for input(s): VITAMINB12, FOLATE, FERRITIN, TIBC, IRON, RETICCTPCT in the last 72 hours. Urine analysis: No results found for: COLORURINE, APPEARANCEUR, LABSPEC, PHURINE, GLUCOSEU, HGBUR, BILIRUBINUR, KETONESUR, PROTEINUR, UROBILINOGEN, NITRITE, LEUKOCYTESUR  Radiological Exams on Admission: CT Angio Head W or Wo Contrast  Result Date: 11/04/2019 CLINICAL DATA:  Monocular vision loss on the right that is recurring today. EXAM: CT ANGIOGRAPHY HEAD AND NECK TECHNIQUE: Multidetector CT imaging of the head and neck was performed using the standard protocol during bolus administration of intravenous contrast. Multiplanar CT image reconstructions and MIPs were obtained to evaluate the vascular anatomy. Carotid stenosis measurements (when applicable) are obtained utilizing NASCET criteria, using the distal internal carotid diameter as the denominator. CONTRAST:  86mL OMNIPAQUE  IOHEXOL 350 MG/ML SOLN COMPARISON:  Brain MRI from earlier today FINDINGS: CT HEAD FINDINGS Brain: No evidence of acute infarction, hemorrhage, hydrocephalus, extra-axial collection or mass lesion/mass effect. Vascular: See below Skull: Normal. Negative for fracture or focal lesion. Sinuses: Negative Orbits: Negative Review of the MIP images confirms the above findings CTA NECK FINDINGS Aortic arch: Normal with 3 vessel branching. Right carotid system: Low-density plaque at the distal common carotid artery. Minimal plaque at the ICA bifurcation. No stenosis or dissection seen. Left carotid system: Mild calcified plaque at the bifurcation. No stenosis or ulceration. Vertebral arteries: Proximal subclavian atherosclerosis. Mild calcified plaque at the proximal right vertebral artery. High-grade narrowing at the left vertebral origin to the degree that luminal measurement is limited. No dissection or beading. Skeleton: Negative Other neck: Negative Upper chest: Clear apical lungs Review of the MIP images confirms the above findings CTA HEAD FINDINGS Anterior circulation: Calcified plaque on the bilateral carotid siphons. No branch occlusion, beading, or aneurysm. Both ophthalmic arteries are symmetrically opacified. Posterior circulation: The vertebral and basilar arteries are widely patent. No branch occlusion, beading, or aneurysm. Venous sinuses: Patent Anatomic variants: None significant Review of the MIP images confirms the above findings IMPRESSION: 1. No emergent finding. No flow limiting stenosis or ulceration seen in the right carotid. 2. Overall mild atherosclerosis in the head and neck, but there is a notable high-grade narrowing at the left vertebral origin. Electronically Signed   By: Marnee Spring M.D.   On: 11/04/2019 05:27   CT HEAD WO CONTRAST  Result Date: 11/03/2019 CLINICAL DATA:  Sudden blindness right eye since this morning EXAM: CT HEAD WITHOUT CONTRAST TECHNIQUE: Contiguous axial images  were obtained from the base of the skull through the vertex without intravenous contrast. COMPARISON:  None. FINDINGS: Brain: No acute infarct or hemorrhage. Lateral ventricles and midline structures are unremarkable. No acute extra-axial fluid collections. No mass effect. Vascular: No hyperdense vessel or unexpected calcification. Skull: Normal. Negative for fracture or focal lesion. Sinuses/Orbits: No acute finding. Other: None. IMPRESSION: 1. No acute intracranial process. Electronically Signed   By: Sharlet Salina M.D.   On: 11/03/2019 20:10   CT Angio  Neck W and/or Wo Contrast  Result Date: 11/04/2019 CLINICAL DATA:  Monocular vision loss on the right that is recurring today. EXAM: CT ANGIOGRAPHY HEAD AND NECK TECHNIQUE: Multidetector CT imaging of the head and neck was performed using the standard protocol during bolus administration of intravenous contrast. Multiplanar CT image reconstructions and MIPs were obtained to evaluate the vascular anatomy. Carotid stenosis measurements (when applicable) are obtained utilizing NASCET criteria, using the distal internal carotid diameter as the denominator. CONTRAST:  7mL OMNIPAQUE IOHEXOL 350 MG/ML SOLN COMPARISON:  Brain MRI from earlier today FINDINGS: CT HEAD FINDINGS Brain: No evidence of acute infarction, hemorrhage, hydrocephalus, extra-axial collection or mass lesion/mass effect. Vascular: See below Skull: Normal. Negative for fracture or focal lesion. Sinuses: Negative Orbits: Negative Review of the MIP images confirms the above findings CTA NECK FINDINGS Aortic arch: Normal with 3 vessel branching. Right carotid system: Low-density plaque at the distal common carotid artery. Minimal plaque at the ICA bifurcation. No stenosis or dissection seen. Left carotid system: Mild calcified plaque at the bifurcation. No stenosis or ulceration. Vertebral arteries: Proximal subclavian atherosclerosis. Mild calcified plaque at the proximal right vertebral artery.  High-grade narrowing at the left vertebral origin to the degree that luminal measurement is limited. No dissection or beading. Skeleton: Negative Other neck: Negative Upper chest: Clear apical lungs Review of the MIP images confirms the above findings CTA HEAD FINDINGS Anterior circulation: Calcified plaque on the bilateral carotid siphons. No branch occlusion, beading, or aneurysm. Both ophthalmic arteries are symmetrically opacified. Posterior circulation: The vertebral and basilar arteries are widely patent. No branch occlusion, beading, or aneurysm. Venous sinuses: Patent Anatomic variants: None significant Review of the MIP images confirms the above findings IMPRESSION: 1. No emergent finding. No flow limiting stenosis or ulceration seen in the right carotid. 2. Overall mild atherosclerosis in the head and neck, but there is a notable high-grade narrowing at the left vertebral origin. Electronically Signed   By: Marnee Spring M.D.   On: 11/04/2019 05:27   MR BRAIN WO CONTRAST  Result Date: 11/04/2019 CLINICAL DATA:  Sudden onset right eye blindness EXAM: MRI HEAD WITHOUT CONTRAST TECHNIQUE: Multiplanar, multiecho pulse sequences of the brain and surrounding structures were obtained without intravenous contrast. COMPARISON:  None. FINDINGS: BRAIN: No acute infarct, acute hemorrhage or extra-axial collection. Normal white matter signal. Normal volume of CSF spaces. No chronic microhemorrhage. Normal midline structures. VASCULAR: Major flow voids are preserved. SKULL AND UPPER CERVICAL SPINE: Normal calvarium and skull base. Visualized upper cervical spine and soft tissues are normal. SINUSES/ORBITS: No paranasal sinus fluid levels or advanced mucosal thickening. No mastoid or middle ear effusion. Normal orbits. IMPRESSION: Normal MRI of the brain. Electronically Signed   By: Deatra Robinson M.D.   On: 11/04/2019 01:23    EKG: Independently reviewed.  Sinus bradycardia  Assessment/Plan Principal  Problem:   Acute loss of vision, right Active Problems:   CAD (coronary artery disease)   Hypertension   Type 2 diabetes mellitus without complication (HCC)   Sudden loss of vision, right   Bradycardia    Sudden loss of vision, right ? Amaurosis Fugax Continue antiplatelet therapy as well as statins We will request neurology consult Optimize blood pressure control   Diabetes mellitus  Maintain consistent carbohydrate diet Place patient on sliding scale coverage   Bradycardia Asymptomatic Most likely medication induced Hold carvedilol Obtain TSH levels   Hypertension Blood pressure stable Continue lisinopril   DVT prophylaxis:  Lovenox Code Status: Full Family Communication: Greater than  50% of time was spent discussing patient's condition and plan of care with him and his wife at the bedside.  All questions and concerns have been addressed. Disposition Plan: Back to previous home environment Consults called: Neurology    Lucile Shutters MD [facility]     11/04/2019, 10:30 AM

## 2019-11-04 NOTE — ED Notes (Signed)
Pt using restroom

## 2019-11-05 ENCOUNTER — Observation Stay (HOSPITAL_BASED_OUTPATIENT_CLINIC_OR_DEPARTMENT_OTHER)
Admit: 2019-11-05 | Discharge: 2019-11-05 | Disposition: A | Discharge status: Home / Self Care | Payer: 59 | Attending: Neurology | Admitting: Neurology

## 2019-11-05 ENCOUNTER — Other Ambulatory Visit: Payer: Self-pay

## 2019-11-05 ENCOUNTER — Telehealth: Payer: Self-pay | Admitting: Cardiovascular Disease

## 2019-11-05 DIAGNOSIS — H53131 Sudden visual loss, right eye: Secondary | ICD-10-CM | POA: Diagnosis not present

## 2019-11-05 DIAGNOSIS — G453 Amaurosis fugax: Secondary | ICD-10-CM

## 2019-11-05 DIAGNOSIS — R001 Bradycardia, unspecified: Secondary | ICD-10-CM | POA: Diagnosis not present

## 2019-11-05 DIAGNOSIS — I340 Nonrheumatic mitral (valve) insufficiency: Secondary | ICD-10-CM

## 2019-11-05 LAB — GLUCOSE, CAPILLARY
Glucose-Capillary: 123 mg/dL — ABNORMAL HIGH (ref 70–99)
Glucose-Capillary: 130 mg/dL — ABNORMAL HIGH (ref 70–99)
Glucose-Capillary: 98 mg/dL (ref 70–99)

## 2019-11-05 NOTE — Telephone Encounter (Signed)
L MOM for patient to schedule surgical clearance appointment for colonoscopy in September. Dr. Kirke Corin or APP

## 2019-11-05 NOTE — Progress Notes (Signed)
*  PRELIMINARY RESULTS* Echocardiogram 2D Echocardiogram has been performed.  Joanette Gula Arye Weyenberg 11/05/2019, 11:48 AM

## 2019-11-05 NOTE — Discharge Summary (Signed)
Jeff Howard ZOX:[id number] DOB: 03/18/1961 DOA: 11/04/2019  PCP: Nonda LouShapely-Quinn, Todd [place], MD  Admit date: 11/04/2019 Discharge date: 11/05/2019  Admitted From: Home Disposition: Home  Recommendations for Outpatient Follow-up:  1. Follow up with PCP in 1 week 2. Please obtain BMP/CBC in one week      Discharge Condition:Stable CODE STATUS: Full Diet recommendation: Heart Healthy Brief/Interim Summary: Jeff Galasrmando Durney Howard is a 59 y.o. male with medical history significant for coronary artery disease status post stent angioplasty on DAPT, history of diabetes mellitus and hypertension who presented to the emergency room for evaluation of episodic right eye vision loss.  Patient reported that his symptoms started about 11 AM and felt like a black curtain was drawn over his right eye.  It was intermittent and lasted about 15 to 20 minutes with complete resolution.  He had about 4-5 episodes over the course of the day but at the time of this history and physical his vision is back to normal.  He denies having any headache or neck pain but states that he recently had a massage of his shoulders and neck. He denied having any headaches, no fever, no chills, no chest pain, no shortness of breath, no nausea, no vomiting, no abdominal pain or any urinary symptoms.CT scan of the head done without contrast and an MRI of the brain which did not show any acute findings.CTA shows mild atherosclerotic disease. Ophthalmology was consulted however they will see as outpatient.  His vision did come back.  He had a echocardiogram that revealed normal EF and with agitated saline there was no evidence of interatrial shunt.  Spoke to cardiology and event monitor will be sent to him for placement and to follow-up with Dr. Kirke CorinArida as outpatient.  Neurology was consulted, recommended target LDL less than 70, Liberal blood pressure management to encourage cerebral perfusion.  He is already on aspirin and Plavix and  should continue.   Today he has no symptoms.  And he feels like he is ready to go home.  Discharge Diagnoses:  Principal Problem:   Acute loss of vision, right Active Problems:   CAD (coronary artery disease)   Hypertension   Type 2 diabetes mellitus without complication (HCC)   Sudden loss of vision, right   Bradycardia    Discharge Instructions  Discharge Instructions    Call MD for:  persistant dizziness or light-headedness   Complete by: As directed    Diet - low sodium heart healthy   Complete by: As directed    Discharge instructions   Complete by: As directed    Call cardiology office for monitor if you dont receive it in few days  F/u with pcp in one week   Increase activity slowly   Complete by: As directed      Allergies as of 11/05/2019      Reactions   Bee Venom Anaphylaxis   Effient [prasugrel]    Rash      Medication List    TAKE these medications   aspirin 81 MG tablet Take 81 mg by mouth daily.   atorvastatin 80 MG tablet Commonly known as: LIPITOR Take 1 tablet (80 mg total) by mouth daily at 6 PM.   carvedilol 3.125 MG tablet Commonly known as: COREG Take 1 tablet (3.125 mg total) by mouth 2 (two) times daily.   clopidogrel 75 MG tablet Commonly known as: PLAVIX Take 1 tablet (75 mg total) by mouth daily.   EPINEPHrine 0.3 mg/0.3 mL Soaj injection Commonly  known as: EPI-PEN Inject 0.3 mLs (0.3 mg total) into the muscle once. Follow package instructions as needed for severe allergy or anaphylactic reaction.   lisinopril 5 MG tablet Commonly known as: ZESTRIL Take 1 tablet (5 mg total) by mouth daily. What changed: when to take this   metFORMIN 500 MG 24 hr tablet Commonly known as: GLUCOPHAGE-XR Take 500 mg by mouth daily with lunch.   nitroGLYCERIN 0.4 MG SL tablet Commonly known as: NITROSTAT Place 1 tablet (0.4 mg total) under the tongue every 5 (five) minutes as needed for chest pain.   pioglitazone 45 MG tablet Commonly  known as: ACTOS Take 45 mg by mouth at bedtime.      Follow-up Information    Iran Ouch, MD In 2 weeks.   Specialty: Cardiology Contact information: [address] [place] [address] [phone]        [name], MD On 11/16/2019.   Specialty: Family Medicine Why: at 2:00 p.m. Contact information: [address] [place] [address] [phone]        Elliot Cousin, MD Follow up in 1 week(s).   Specialty: Ophthalmology Contact information: [address] [place] [address] [phone]          Allergies  Allergen Reactions  . Bee Venom Anaphylaxis  . Effient [Prasugrel]     Rash    Consultations:  Neurology, ophthalmology   Procedures/Studies: CT Angio Head W or Wo Contrast  Result Date: 11/04/2019 CLINICAL DATA:  Monocular vision loss on the right that is recurring today. EXAM: CT ANGIOGRAPHY HEAD AND NECK TECHNIQUE: Multidetector CT imaging of the head and neck was performed using the standard protocol during bolus administration of intravenous contrast. Multiplanar CT image reconstructions and MIPs were obtained to evaluate the vascular anatomy. Carotid stenosis measurements (when applicable) are obtained utilizing NASCET criteria, using the distal internal carotid diameter as the denominator. CONTRAST:  23mL OMNIPAQUE IOHEXOL 350 MG/ML SOLN COMPARISON:  Brain MRI from earlier today FINDINGS: CT HEAD FINDINGS Brain: No evidence of acute infarction, hemorrhage, hydrocephalus, extra-axial collection or mass lesion/mass effect. Vascular: See below Skull: Normal. Negative for fracture or focal lesion. Sinuses: Negative Orbits: Negative Review of the MIP images confirms the above findings CTA NECK FINDINGS Aortic arch: Normal with 3 vessel branching. Right carotid system: Low-density plaque at the distal common carotid artery. Minimal plaque at the ICA bifurcation. No stenosis or dissection  seen. Left carotid system: Mild calcified plaque at the bifurcation. No stenosis or ulceration. Vertebral arteries: Proximal subclavian atherosclerosis. Mild calcified plaque at the proximal right vertebral artery. High-grade narrowing at the left vertebral origin to the degree that luminal measurement is limited. No dissection or beading. Skeleton: Negative Other neck: Negative Upper chest: Clear apical lungs Review of the MIP images confirms the above findings CTA HEAD FINDINGS Anterior circulation: Calcified plaque on the bilateral carotid siphons. No branch occlusion, beading, or aneurysm. Both ophthalmic arteries are symmetrically opacified. Posterior circulation: The vertebral and basilar arteries are widely patent. No branch occlusion, beading, or aneurysm. Venous sinuses: Patent Anatomic variants: None significant Review of the MIP images confirms the above findings IMPRESSION: 1. No emergent finding. No flow limiting stenosis or ulceration seen in the right carotid. 2. Overall mild atherosclerosis in the head and neck, but there is a notable high-grade narrowing at the left vertebral origin. Electronically Signed   By: Marnee Spring M.D.   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 COMPARISON:  None. FINDINGS: Brain: No acute infarct or hemorrhage. Lateral ventricles and midline structures are unremarkable. No acute extra-axial fluid collections. No mass effect. Vascular: No hyperdense vessel or unexpected calcification. Skull: Normal. Negative for fracture or focal lesion. Sinuses/Orbits: No acute finding. Other: None. IMPRESSION: 1. No acute intracranial process. Electronically Signed   By: Randa Ngo M.D.   On: 11/03/2019 20:10   CT Angio Neck W and/or  Wo Contrast  Result Date: 11/04/2019 CLINICAL DATA:  Monocular vision loss on the right that is recurring today. EXAM: CT ANGIOGRAPHY HEAD AND NECK TECHNIQUE: Multidetector CT imaging of the head and neck was performed using the standard protocol during bolus administration of intravenous contrast. Multiplanar CT image reconstructions and MIPs were obtained to evaluate the vascular anatomy. Carotid stenosis measurements (when applicable) are obtained utilizing NASCET criteria, using the distal internal carotid diameter as the denominator. CONTRAST:  27mL OMNIPAQUE IOHEXOL 350 MG/ML SOLN COMPARISON:  Brain MRI from earlier today FINDINGS: CT HEAD FINDINGS Brain: No evidence of acute infarction, hemorrhage, hydrocephalus, extra-axial collection or mass lesion/mass effect. Vascular: See below Skull: Normal. Negative for fracture or focal lesion. Sinuses: Negative Orbits: Negative Review of the MIP images confirms the above findings CTA NECK FINDINGS Aortic arch: Normal with 3 vessel branching. Right carotid system: Low-density plaque at the distal common carotid artery. Minimal plaque at the ICA bifurcation. No stenosis or dissection seen. Left carotid system: Mild calcified plaque at the bifurcation. No stenosis or ulceration. Vertebral arteries: Proximal subclavian atherosclerosis. Mild calcified plaque at the proximal right vertebral artery. High-grade narrowing at the left vertebral origin to the degree that luminal measurement is limited. No dissection or beading. Skeleton: Negative Other neck: Negative Upper chest: Clear apical lungs Review of the MIP images confirms the above findings CTA HEAD FINDINGS Anterior circulation: Calcified plaque on the bilateral carotid siphons. No branch occlusion, beading, or aneurysm. Both ophthalmic arteries are symmetrically opacified. Posterior circulation: The vertebral and basilar arteries are widely patent. No branch occlusion, beading, or aneurysm. Venous sinuses: Patent  Anatomic variants: None significant Review of the MIP images confirms the above findings IMPRESSION: 1. No emergent finding. No flow limiting stenosis or ulceration seen in the right carotid. 2. Overall mild atherosclerosis in the head and neck, but there is a notable high-grade narrowing at the left vertebral origin. Electronically Signed   By: Monte Fantasia M.D.   On: 11/04/2019 05:27   MR BRAIN WO CONTRAST  Result Date: 11/04/2019 CLINICAL DATA:  Sudden onset right eye blindness EXAM: MRI HEAD WITHOUT CONTRAST TECHNIQUE: Multiplanar, multiecho pulse sequences of the brain and surrounding structures were obtained without intravenous contrast. COMPARISON:  None. FINDINGS: BRAIN: No acute infarct, acute hemorrhage or extra-axial collection. Normal white matter signal. Normal volume of CSF spaces. No chronic microhemorrhage. Normal midline structures. VASCULAR: Major flow voids are preserved. SKULL AND UPPER CERVICAL SPINE: Normal calvarium and skull base. Visualized upper cervical spine and soft tissues are normal. SINUSES/ORBITS: No paranasal sinus fluid levels or advanced mucosal thickening. No mastoid or middle ear effusion. Normal orbits. IMPRESSION: Normal MRI of the brain. Electronically Signed   By: Ulyses Jarred M.D.   On: 11/04/2019 01:23   ECHOCARDIOGRAM COMPLETE BUBBLE STUDY  Result Date: 11/05/2019    ECHOCARDIOGRAM REPORT   Patient Name:  Janalyn Harder Howard Date of Exam: 11/05/2019 Medical Rec #:  [id number]             Height:       63.0 in Accession #:    [id number]            Weight:       170.9 lb Date of Birth:  05/09/1961              BSA:          1.808 m Patient Age:    59 years              BP:           132/77 mmHg Patient Gender: M                     HR:           54 bpm. Exam Location:  [facility] Procedure: 2D Echo, Color Doppler, Cardiac Doppler and Saline Contrast Bubble            Study Indications:     G45.9 TIA  History:         Patient has prior history of Echocardiogram  examinations. ICM,                  CAD; Risk Factors:Hypertension, Dyslipidemia and Diabetes.  Sonographer:     Humphrey Rolls RDCS (AE) Referring Phys:  [id number] LESLIE REYNOLDS Diagnosing Phys: Julien Nordmann MD IMPRESSIONS  1. Left ventricular ejection fraction, by estimation, is 60 to 65%. The left ventricle has normal function. The left ventricle has no regional wall motion abnormalities. Left ventricular diastolic parameters are consistent with Grade I diastolic dysfunction (impaired relaxation).  2. Right ventricular systolic function is normal. The right ventricular size is normal.  3. Mild mitral valve regurgitation.  4. Agitated saline contrast bubble study was negative, with no evidence of any interatrial shunt. FINDINGS  Left Ventricle: Left ventricular ejection fraction, by estimation, is 60 to 65%. The left ventricle has normal function. The left ventricle has no regional wall motion abnormalities. The left ventricular internal cavity size was normal in size. There is  no left ventricular hypertrophy. Left ventricular diastolic parameters are consistent with Grade I diastolic dysfunction (impaired relaxation). Right Ventricle: The right ventricular size is normal. No increase in right ventricular wall thickness. Right ventricular systolic function is normal. Left Atrium: Left atrial size was normal in size. Right Atrium: Right atrial size was normal in size. Pericardium: There is no evidence of pericardial effusion. Mitral Valve: The mitral valve is normal in structure. Normal mobility of the mitral valve leaflets. Mild mitral valve regurgitation. No evidence of mitral valve stenosis. MV peak gradient, 3.0 mmHg. The mean mitral valve gradient is 1.0 mmHg. Tricuspid Valve: The tricuspid valve is normal in structure. Tricuspid valve regurgitation is not demonstrated. No evidence of tricuspid stenosis. Aortic Valve: The aortic valve is normal in structure. Aortic valve regurgitation is trivial. No aortic  stenosis is present. Aortic valve mean gradient measures 3.0 mmHg. Aortic valve peak gradient measures 6.2 mmHg. Aortic valve area, by VTI measures 3.91 cm. Pulmonic Valve: The pulmonic valve was normal in structure. Pulmonic valve regurgitation is not visualized. No evidence of pulmonic stenosis. Aorta: The aortic root is normal in size and structure. Venous: The inferior vena cava is normal in size with greater than 50% respiratory variability, suggesting right atrial pressure of 3 mmHg. IAS/Shunts: No atrial level shunt detected by color flow  Doppler. Agitated saline contrast was given intravenously to evaluate for intracardiac shunting. Agitated saline contrast bubble study was negative, with no evidence of any interatrial shunt.  LEFT VENTRICLE PLAX 2D LVIDd:         5.05 cm  Diastology LVIDs:         3.26 cm  LV e' lateral:   10.10 cm/s LV PW:         1.11 cm  LV E/e' lateral: 6.6 LV IVS:        0.86 cm  LV e' medial:    7.18 cm/s LVOT diam:     2.20 cm  LV E/e' medial:  9.3 LV SV:         85 LV SV Index:   47 LVOT Area:     3.80 cm  RIGHT VENTRICLE RV Basal diam:  2.48 cm LEFT ATRIUM             Index       RIGHT ATRIUM           Index LA diam:        3.70 cm 2.05 cm/m  RA Area:     11.70 cm LA Vol (A2C):   40.7 ml 22.51 ml/m RA Volume:   23.60 ml  13.05 ml/m LA Vol (A4C):   54.8 ml 30.30 ml/m LA Biplane Vol: 50.0 ml 27.65 ml/m  AORTIC VALVE                   PULMONIC VALVE AV Area (Vmax):    3.22 cm    PV Vmax:       0.90 m/s AV Area (Vmean):   3.43 cm    PV Vmean:      64.600 cm/s AV Area (VTI):     3.91 cm    PV VTI:        0.174 m AV Vmax:           125.00 cm/s PV Peak grad:  3.2 mmHg AV Vmean:          78.900 cm/s PV Mean grad:  2.0 mmHg AV VTI:            0.217 m AV Peak Grad:      6.2 mmHg AV Mean Grad:      3.0 mmHg LVOT Vmax:         106.00 cm/s LVOT Vmean:        71.100 cm/s LVOT VTI:          0.223 m LVOT/AV VTI ratio: 1.03  AORTA Ao Root diam: 3.30 cm MITRAL VALVE MV Area (PHT): 2.85  cm    SHUNTS MV Peak grad:  3.0 mmHg    Systemic VTI:  0.22 m MV Mean grad:  1.0 mmHg    Systemic Diam: 2.20 cm MV Vmax:       0.86 m/s MV Vmean:      44.0 cm/s MV Decel Time: 266 msec MV E velocity: 66.80 cm/s MV A velocity: 78.00 cm/s MV E/A ratio:  0.86 Julien Nordmann MD Electronically signed by Julien Nordmann MD Signature Date/Time: 11/05/2019/2:57:29 PM    Final       Subjective: Denies any headaches, vision changes, weakness or any other symptoms  Discharge Exam: Vitals:   11/04/19 2334 11/05/19 0746  BP: 132/77   Pulse: (!) 55   Resp: 18   Temp: 98 F (36.7 C) 98.1 F (36.7 C)  SpO2: 99% 98%   Vitals:   11/04/19  1930 11/04/19 1955 11/04/19 2334 11/05/19 0746  BP: 133/75 (!) 160/76 132/77   Pulse: (!) 55 (!) 54 (!) 55   Resp:  19 18   Temp:  98.2 F (36.8 C) 98 F (36.7 C) 98.1 F (36.7 C)  TempSrc:  Oral Oral   SpO2: 98% 100% 99% 98%  Weight:  77.5 kg    Height:   (1.6 m)      General: Pt is alert, awake, not in acute distress Cardiovascular: RRR, S1/S2 +, no rubs, no gallops Respiratory: CTA bilaterally, no wheezing, no rhonchi Abdominal: Soft, NT, ND, bowel sounds + Extremities: no edema, no cyanosis Neurologic exam cranial nerve II to XII grossly intact.  Nose to finger test unremarkable.  5 out of 5 motor strength x4.   The results of significant diagnostics from this hospitalization (including imaging, microbiology, ancillary and laboratory) are listed below for reference.     Microbiology: Recent Results (from the past 240 hour(s))  SARS Coronavirus 2 by RT PCR (hospital order, performed in [facility] hospital lab) Nasopharyngeal Nasopharyngeal Swab     Status: None   Collection Time: 11/04/19  4:28 AM   Specimen: Nasopharyngeal Swab  Result Value Ref Range Status   SARS Coronavirus 2 NEGATIVE NEGATIVE Final    Comment: (NOTE) SARS-CoV-2 target nucleic acids are NOT DETECTED. The SARS-CoV-2 RNA is generally detectable in upper and  lower respiratory specimens during the acute phase of infection. The lowest concentration of SARS-CoV-2 viral copies this assay can detect is 250 copies / mL. A negative result does not preclude SARS-CoV-2 infection and should not be used as the sole basis for treatment or other patient management decisions.  A negative result may occur with improper specimen collection / handling, submission of specimen other than nasopharyngeal swab, presence of viral mutation(s) within the areas targeted by this assay, and inadequate number of viral copies (<250 copies / mL). A negative result must be combined with clinical observations, patient history, and epidemiological information. Fact Sheet for Patients:   BoilerBrush.com.cy Fact Sheet for Healthcare Providers: https://pope.com/ This test is not yet approved or cleared  by the [place] FDA and has been authorized for detection and/or diagnosis of SARS-CoV-2 by FDA under an Emergency Use Authorization (EUA).  This EUA will remain in effect (meaning this test can be used) for the duration of the COVID-19 declaration under Section 564(b)(1) of the Act, 21 U.S.C. section 360bbb-3(b)(1), unless the authorization is terminated or revoked sooner. Performed at [facility], [address] [place], [place] [address]      Labs: BNP (last 3 results) No results for input(s): BNP in the last 8760 hours. Basic Metabolic Panel: Recent Labs  Lab 11/03/19 1955 11/04/19 0544  NA 139  --   K 4.3  --   CL 105  --   CO2 27  --   GLUCOSE 112*  --   BUN 23  --   CREATININE 0.94 0.83  CALCIUM 9.0  --    Liver Function Tests: Recent Labs  Lab 11/03/19 1955  AST 20  ALT 24  ALKPHOS 66  BILITOT 0.8  PROT 7.0  ALBUMIN 4.1   No results for input(s): LIPASE, AMYLASE in the last 168 hours. No results for input(s): AMMONIA in the last 168 hours. CBC: Recent Labs  Lab 11/03/19 1955  11/04/19 0544  WBC 5.7 5.3  NEUTROABS 2.8  --   HGB 13.8 13.6  HCT 41.5 40.1  MCV 87.9 85.9  PLT 196 190   Cardiac Enzymes: No results for input(s): CKTOTAL, CKMB, CKMBINDEX, TROPONINI in the last 168 hours. BNP: Invalid input(s): POCBNP CBG: Recent Labs  Lab 11/04/19 1203 11/04/19 1647 11/04/19 2105 11/05/19 0750 11/05/19 1226  GLUCAP 202* 110* 123* 130* 98   D-Dimer No results for input(s): DDIMER in the last 72 hours. Hgb A1c Recent Labs    11/04/19 0544  HGBA1C 7.2*   Lipid Profile No results for input(s): CHOL, HDL, LDLCALC, TRIG, CHOLHDL, LDLDIRECT in the last 72 hours. Thyroid function studies Recent Labs    11/04/19 1217  TSH 3.120   Anemia work up No results for input(s): VITAMINB12, FOLATE, FERRITIN, TIBC, IRON, RETICCTPCT in the last 72 hours. Urinalysis No results found for: COLORURINE, APPEARANCEUR, LABSPEC, PHURINE, GLUCOSEU, HGBUR, BILIRUBINUR, KETONESUR, PROTEINUR, UROBILINOGEN, NITRITE, LEUKOCYTESUR Sepsis Labs Invalid input(s): PROCALCITONIN,  WBC,  LACTICIDVEN Microbiology Recent Results (from the past 240 hour(s))  SARS Coronavirus 2 by RT PCR (hospital order, performed in [facility] hospital lab) Nasopharyngeal Nasopharyngeal Swab     Status: None   Collection Time: 11/04/19  4:28 AM   Specimen: Nasopharyngeal Swab  Result Value Ref Range Status   SARS Coronavirus 2 NEGATIVE NEGATIVE Final    Comment: (NOTE) SARS-CoV-2 target nucleic acids are NOT DETECTED. The SARS-CoV-2 RNA is generally detectable in upper and lower respiratory specimens during the acute phase of infection. The lowest concentration of SARS-CoV-2 viral copies this assay can detect is 250 copies / mL. A negative result does not preclude SARS-CoV-2 infection and should not be used as the sole basis for treatment or other patient management decisions.  A negative result may occur with improper specimen collection / handling, submission of specimen other than  nasopharyngeal swab, presence of viral mutation(s) within the areas targeted by this assay, and inadequate number of viral copies (<250 copies / mL). A negative result must be combined with clinical observations, patient history, and epidemiological information. Fact Sheet for Patients:   BoilerBrush.com.cy Fact Sheet for Healthcare Providers: https://pope.com/ This test is not yet approved or cleared  by the [place] FDA and has been authorized for detection and/or diagnosis of SARS-CoV-2 by FDA under an Emergency Use Authorization (EUA).  This EUA will remain in effect (meaning this test can be used) for the duration of the COVID-19 declaration under Section 564(b)(1) of the Act, 21 U.S.C. section 360bbb-3(b)(1), unless the authorization is terminated or revoked sooner. Performed at [facility], [address] [place], [place] [address]      Time coordinating discharge: Over 30 minutes  SIGNED:   Lynn Ito, MD  [facility] 11/05/2019, 5:44 PM Pager   If 7PM-7AM, please contact night-coverage [email or web address] Password [email or web address]

## 2019-11-05 NOTE — Progress Notes (Signed)
Discharge instructions given to patient and wife (documentation provided in Spanish). Verbalized understanding of all instructions, including follow up appointments. Pt discharged to home in stable condition.

## 2019-11-05 NOTE — Progress Notes (Signed)
Subjective: No new neurological complaints.  Patient has had no further episodes of vision loss since yesterday morning.    Objective: Current vital signs: BP 132/77 (BP Location: Left Arm)   Pulse (!) 55   Temp 98.1 F (36.7 C)   Resp 18   Ht _0  (1.6 m)   Wt 77.5 kg   SpO2 98%   BMI 30.27 kg/m  Vital signs in last 24 hours: Temp:  [98 F (36.7 C)-98.2 F (36.8 C)] 98.1 F (36.7 C) (05/28 0746) Pulse Rate:  [51-70] 55 (05/27 2334) Resp:  [14-19] 18 (05/27 2334) BP: (126-160)/(75-87) 132/77 (05/27 2334) SpO2:  [96 %-100 %] 98 % (05/28 0746) Weight:  [77.5 kg] 77.5 kg (05/27 1955)  Intake/Output from previous day: 05/27 0701 - 05/28 0700 In: -  Out: 500 [Urine:500] Intake/Output this shift: Total I/O In: 120 [P.O.:120] Out: -  Nutritional status:  Diet Order            Diet heart healthy/carb modified Room service appropriate? Yes; Fluid consistency: Thin  Diet effective now              Neurologic Exam: Mental Status: Alert, oriented, thought content appropriate.  Speech fluent without evidence of aphasia.  Able to follow 3 step commands without difficulty. Cranial Nerves: II: Discs flat bilaterally; Visual fields grossly normal, pupils equal, round, reactive to light and accommodation III,IV, VI: ptosis not present, extra-ocular motions intact bilaterally V,VII: smile symmetric, facial light touch sensation normal bilaterally VIII: hearing normal bilaterally IX,X: gag reflex present XI: bilateral shoulder shrug XII: midline tongue extension Motor: 5/5 throughout Sensory: Pinprick and light touch intact throughout, bilaterally   Lab Results: Basic Metabolic Panel: Recent Labs  Lab 11/03/19 1955 11/04/19 0544  NA 139  --   K 4.3  --   CL 105  --   CO2 27  --   GLUCOSE 112*  --   BUN 23  --   CREATININE 0.94 0.83  CALCIUM 9.0  --     Liver Function Tests: Recent Labs  Lab 11/03/19 1955  AST 20  ALT 24  ALKPHOS 66  BILITOT 0.8  PROT  7.0  ALBUMIN 4.1   No results for input(s): LIPASE, AMYLASE in the last 168 hours. No results for input(s): AMMONIA in the last 168 hours.  CBC: Recent Labs  Lab 11/03/19 1955 11/04/19 0544  WBC 5.7 5.3  NEUTROABS 2.8  --   HGB 13.8 13.6  HCT 41.5 40.1  MCV 87.9 85.9  PLT 196 190    Cardiac Enzymes: No results for input(s): CKTOTAL, CKMB, CKMBINDEX, TROPONINI in the last 168 hours.  Lipid Panel: No results for input(s): CHOL, TRIG, HDL, CHOLHDL, VLDL, LDLCALC in the last 168 hours.  CBG: Recent Labs  Lab 11/04/19 0903 11/04/19 1203 11/04/19 1647 11/04/19 2105 11/05/19 0750  GLUCAP 106* 202* 110* 123* 130*    Microbiology: Results for orders placed or performed during the hospital encounter of 11/04/19  SARS Coronavirus 2 by RT PCR (hospital order, performed in [facility] hospital lab) Nasopharyngeal Nasopharyngeal Swab     Status: None   Collection Time: 11/04/19  4:28 AM   Specimen: Nasopharyngeal Swab  Result Value Ref Range Status   SARS Coronavirus 2 NEGATIVE NEGATIVE Final    Comment: (NOTE) SARS-CoV-2 target nucleic acids are NOT DETECTED. The SARS-CoV-2 RNA is generally detectable in upper and lower respiratory specimens during the acute phase of infection. The lowest concentration of SARS-CoV-2 viral copies this assay  can detect is 250 copies / mL. A negative result does not preclude SARS-CoV-2 infection and should not be used as the sole basis for treatment or other patient management decisions.  A negative result may occur with improper specimen collection / handling, submission of specimen other than nasopharyngeal swab, presence of viral mutation(s) within the areas targeted by this assay, and inadequate number of viral copies (<250 copies / mL). A negative result must be combined with clinical observations, patient history, and epidemiological information. Fact Sheet for Patients:   StrictlyIdeas.no Fact Sheet for  Healthcare Providers: BankingDealers.co.za This test is not yet approved or cleared  by the [place] FDA and has been authorized for detection and/or diagnosis of SARS-CoV-2 by FDA under an Emergency Use Authorization (EUA).  This EUA will remain in effect (meaning this test can be used) for the duration of the COVID-19 declaration under Section 564(b)(1) of the Act, 21 U.S.C. section 360bbb-3(b)(1), unless the authorization is terminated or revoked sooner. Performed at [facility], [address] [place], [place] [address]     Coagulation Studies: Recent Labs    11/03/19 1955  LABPROT 12.6  INR 1.0    Imaging: CT Angio Head W or Wo Contrast  Result Date: 11/04/2019 CLINICAL DATA:  Monocular vision loss on the right that is recurring today. EXAM: CT ANGIOGRAPHY HEAD AND NECK TECHNIQUE: Multidetector CT imaging of the head and neck was performed using the standard protocol during bolus administration of intravenous contrast. Multiplanar CT image reconstructions and MIPs were obtained to evaluate the vascular anatomy. Carotid stenosis measurements (when applicable) are obtained utilizing NASCET criteria, using the distal internal carotid diameter as the denominator. CONTRAST:  74m OMNIPAQUE IOHEXOL 350 MG/ML SOLN COMPARISON:  Brain MRI from earlier today FINDINGS: CT HEAD FINDINGS Brain: No evidence of acute infarction, hemorrhage, hydrocephalus, extra-axial collection or mass lesion/mass effect. Vascular: See below Skull: Normal. Negative for fracture or focal lesion. Sinuses: Negative Orbits: Negative Review of the MIP images confirms the above findings CTA NECK FINDINGS Aortic arch: Normal with 3 vessel branching. Right carotid system: Low-density plaque at the distal common carotid artery. Minimal plaque at the ICA bifurcation. No stenosis or dissection seen. Left carotid system: Mild calcified plaque at the bifurcation. No stenosis or ulceration.  Vertebral arteries: Proximal subclavian atherosclerosis. Mild calcified plaque at the proximal right vertebral artery. High-grade narrowing at the left vertebral origin to the degree that luminal measurement is limited. No dissection or beading. Skeleton: Negative Other neck: Negative Upper chest: Clear apical lungs Review of the MIP images confirms the above findings CTA HEAD FINDINGS Anterior circulation: Calcified plaque on the bilateral carotid siphons. No branch occlusion, beading, or aneurysm. Both ophthalmic arteries are symmetrically opacified. Posterior circulation: The vertebral and basilar arteries are widely patent. No branch occlusion, beading, or aneurysm. Venous sinuses: Patent Anatomic variants: None significant Review of the MIP images confirms the above findings IMPRESSION: 1. No emergent finding. No flow limiting stenosis or ulceration seen in the right carotid. 2. Overall mild atherosclerosis in the head and neck, but there is a notable high-grade narrowing at the left vertebral origin. Electronically Signed   By: JMonte FantasiaM.D.   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 COMPARISON:  None. FINDINGS: Brain: No acute infarct  or hemorrhage. Lateral ventricles and midline structures are unremarkable. No acute extra-axial fluid collections. No mass effect. Vascular: No hyperdense vessel or unexpected calcification. Skull: Normal. Negative for fracture or focal lesion. Sinuses/Orbits: No acute finding. Other: None. IMPRESSION: 1. No acute intracranial process. Electronically Signed   By: Randa Ngo M.D.   On: 11/03/2019 20:10   CT Angio Neck W and/or Wo Contrast  Result Date: 11/04/2019 CLINICAL DATA:  Monocular vision loss on the right that is  recurring today. EXAM: CT ANGIOGRAPHY HEAD AND NECK TECHNIQUE: Multidetector CT imaging of the head and neck was performed using the standard protocol during bolus administration of intravenous contrast. Multiplanar CT image reconstructions and MIPs were obtained to evaluate the vascular anatomy. Carotid stenosis measurements (when applicable) are obtained utilizing NASCET criteria, using the distal internal carotid diameter as the denominator. CONTRAST:  70m OMNIPAQUE IOHEXOL 350 MG/ML SOLN COMPARISON:  Brain MRI from earlier today FINDINGS: CT HEAD FINDINGS Brain: No evidence of acute infarction, hemorrhage, hydrocephalus, extra-axial collection or mass lesion/mass effect. Vascular: See below Skull: Normal. Negative for fracture or focal lesion. Sinuses: Negative Orbits: Negative Review of the MIP images confirms the above findings CTA NECK FINDINGS Aortic arch: Normal with 3 vessel branching. Right carotid system: Low-density plaque at the distal common carotid artery. Minimal plaque at the ICA bifurcation. No stenosis or dissection seen. Left carotid system: Mild calcified plaque at the bifurcation. No stenosis or ulceration. Vertebral arteries: Proximal subclavian atherosclerosis. Mild calcified plaque at the proximal right vertebral artery. High-grade narrowing at the left vertebral origin to the degree that luminal measurement is limited. No dissection or beading. Skeleton: Negative Other neck: Negative Upper chest: Clear apical lungs Review of the MIP images confirms the above findings CTA HEAD FINDINGS Anterior circulation: Calcified plaque on the bilateral carotid siphons. No branch occlusion, beading, or aneurysm. Both ophthalmic arteries are symmetrically opacified. Posterior circulation: The vertebral and basilar arteries are widely patent. No branch occlusion, beading, or aneurysm. Venous sinuses: Patent Anatomic variants: None significant Review of the MIP images confirms the above findings  IMPRESSION: 1. No emergent finding. No flow limiting stenosis or ulceration seen in the right carotid. 2. Overall mild atherosclerosis in the head and neck, but there is a notable high-grade narrowing at the left vertebral origin. Electronically Signed   By: JMonte FantasiaM.D.   On: 11/04/2019 05:27   MR BRAIN WO CONTRAST  Result Date: 11/04/2019 CLINICAL DATA:  Sudden onset right eye blindness EXAM: MRI HEAD WITHOUT CONTRAST TECHNIQUE: Multiplanar, multiecho pulse sequences of the brain and surrounding structures were obtained without intravenous contrast. COMPARISON:  None. FINDINGS: BRAIN: No acute infarct, acute hemorrhage or extra-axial collection. Normal white matter signal. Normal volume of CSF spaces. No chronic microhemorrhage. Normal midline structures. VASCULAR: Major flow voids are preserved. SKULL AND UPPER CERVICAL SPINE: Normal calvarium and skull base. Visualized upper cervical spine and soft tissues are normal. SINUSES/ORBITS: No paranasal sinus fluid levels or advanced mucosal thickening. No mastoid or middle ear effusion. Normal orbits. IMPRESSION: Normal MRI of the brain. Electronically Signed   By: KUlyses JarredM.D.   On: 11/04/2019 01:23    Medications:  I have reviewed the patient's current medications. Scheduled: . aspirin EC  81 mg Oral Daily  . atorvastatin  80 mg Oral q1800  . clopidogrel  75 mg Oral Daily  . enoxaparin (LOVENOX) injection  40 mg Subcutaneous Q24H  . insulin aspart  0-15 Units Subcutaneous TID WC  . insulin aspart  0-5 Units  Subcutaneous QHS  . lisinopril  5 mg Oral QHS    Assessment/Plan: 59 y.o. 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 MRI of the brain personally reviewed and shows no acute changes.  CTA of the head and  neck reviewed and shows no evidence of LVO.  Patient on ASA and Plavix prior to admission.  Loaded with high dose ASA and Plavix in the ED.  Has had no further events since admission.   CRP 0.6, ESR 7, A1c 7.2   Stroke Risk Factors - diabetes mellitus and hypertension  Plan: 1. Fasting lipid panel.  Target LDL<70.   2. PT consult, OT consult, Speech consult 3. Liberal blood pressure management to encourage cerebral perfusion 4. Prophylactic therapy-Continue ASA and Plavix 5. Telemetry monitoring 6. Frequent neuro checks 7. Echocardiogram with bubble study.  If unremarkable, patient may continue follow up on an outpatient basis.       LOS: 0 days   Alexis Goodell, MD Neurology [phone] 11/05/2019  11:50 AM

## 2019-11-05 NOTE — Telephone Encounter (Signed)
Per Martie Lee in our [place] office she left a vm for the pt to call and schedule appt. Colonoscopy looks like it is scheduled for 02/21/20.

## 2019-11-09 NOTE — Telephone Encounter (Signed)
With the help of [employer] interpreter Byrd Hesselbach ID# [id number] she left a detailed voice message for the patient to give our office a call to get scheduled for a pre op appointment.

## 2019-11-10 NOTE — Telephone Encounter (Signed)
Attempted to contact the patient using the language line Interpreter ID # [id number].  A detailed message was left on the patients voicemail asking that he contact our office.

## 2019-11-10 NOTE — Telephone Encounter (Signed)
Patient has appointment to see Gillian Shields, NP on 11/22/19

## 2019-11-10 NOTE — Telephone Encounter (Signed)
-----   Message from Iran Ouch, MD sent at 11/09/2019  5:31 PM EDT ----- Misty Stanley, Please arrange for 2-week ZIO monitor to evaluate for possible A. fib given recent suspected TIA.  Follow-up with me after.  Thanks. ----- Message ----- From: Rollene Rotunda, MD Sent: 11/04/2019   8:30 PM EDT To: Iran Ouch, MD  Mo,  Can you get this patient an office appt.  I got this message from Dr. Lonzo Cloud.  Thanks.  "This patient was admitted for sudden onset visual loss involving the right eye. I have ordered an echocardiogram for him. Chart review shows that he has seen Dr Kirke Corin in the past. Can we send him to the office as an out patient for possible event monitor to rule out A FIb?s "  Thanks. Leta Jungling

## 2019-11-11 ENCOUNTER — Ambulatory Visit (INDEPENDENT_AMBULATORY_CARE_PROVIDER_SITE_OTHER): Payer: 59

## 2019-11-11 DIAGNOSIS — I491 Atrial premature depolarization: Secondary | ICD-10-CM

## 2019-11-11 DIAGNOSIS — I493 Ventricular premature depolarization: Secondary | ICD-10-CM

## 2019-11-11 DIAGNOSIS — G453 Amaurosis fugax: Secondary | ICD-10-CM

## 2019-11-15 NOTE — Telephone Encounter (Signed)
Second attempt to contact the patient. Using [employer] interpreter ID# [id number].  A detailed message was left on the patients voicemail for him to contact our office.

## 2019-11-17 NOTE — Telephone Encounter (Signed)
3rd attempt to contact the patient. Using [employer] interpreter ID# [phone]. A detailed message has been left for the patient to contact the office. The patient has an appt scheduled with Gillian Shields, NP on 11/22/19.  3 attempts have been made to reach the patient closing this encounter.

## 2019-11-22 ENCOUNTER — Encounter: Payer: Self-pay | Admitting: Family

## 2019-11-22 ENCOUNTER — Ambulatory Visit: Payer: 59 | Admitting: Family

## 2019-11-22 ENCOUNTER — Other Ambulatory Visit: Payer: Self-pay

## 2019-11-22 VITALS — BP 110/60 | HR 51 | Ht 65.0 in | Wt 173.0 lb

## 2019-11-22 DIAGNOSIS — I10 Essential (primary) hypertension: Secondary | ICD-10-CM

## 2019-11-22 DIAGNOSIS — Z01810 Encounter for preprocedural cardiovascular examination: Secondary | ICD-10-CM | POA: Diagnosis not present

## 2019-11-22 DIAGNOSIS — I2510 Atherosclerotic heart disease of native coronary artery without angina pectoris: Secondary | ICD-10-CM | POA: Diagnosis not present

## 2019-11-22 DIAGNOSIS — I255 Ischemic cardiomyopathy: Secondary | ICD-10-CM | POA: Diagnosis not present

## 2019-11-22 NOTE — Patient Instructions (Addendum)
Medication Instructions:  Your physician recommends that you continue on your current medications as directed. Please refer to the Current Medication list given to you today.  *If you need a refill on your cardiac medications before your next appointment, please call your pharmacy*   Lab Work: None  If you have labs (blood work) drawn today and your tests are completely normal, you will receive your results only by: Marland Kitchen MyChart Message (if you have MyChart) OR . A paper copy in the mail If you have any lab test that is abnormal or we need to change your treatment, we will call you to review the results.   Testing/Procedures: None  Follow-Up: At [facility], you and your health needs are our priority.  As part of our continuing mission to provide you with exceptional heart care, we have created designated Provider Care Teams.  These Care Teams include your primary Cardiologist (physician) and Advanced Practice Providers (APPs -  Physician Assistants and Nurse Practitioners) who all work together to provide you with the care you need, when you need it.  We recommend signing up for the patient portal called "MyChart".  Sign up information is provided on this After Visit Summary.  MyChart is used to connect with patients for Virtual Visits (Telemedicine).  Patients are able to view lab/test results, encounter notes, upcoming appointments, etc.  Non-urgent messages can be sent to your provider as well.   To learn more about what you can do with MyChart, go to [email or web address].    Your next appointment:   4-6 month(s)  The format for your next appointment:   In Person  Provider:    You may see Dr. [name] or one of the following Advanced Practice Providers on your designated Care Team:    Nicolasa Ducking, NP  Eula Listen, PA-C  Marisue Ivan, PA-C  Other Instructions Gillian Shields, NP will discuss your Plavix (Clopidogrel) prior to your upcoming colonoscopy. We  will send a message to Dr. Norma Fredrickson and you once this clearance is received. You will likely hear something in about 2 weeks.

## 2019-11-22 NOTE — Progress Notes (Addendum)
Office Visit    Patient Name: Jeff Howard Date of Encounter: 11/22/2019  Primary Care Provider:  [name], MD Primary Cardiologist:  [name], MD Electrophysiologist:  None   Chief Complaint    Jeff Howard is a 59 y.o. male with a hx of  CAD s/p stenting 02/2015, DM 2, HTN, amaurosis fugax of right eye ?TIA, diabetic retinopathy presents today for follow up after hospitalization.  Past Medical History    Past Medical History:  Diagnosis Date  . CAD (coronary artery disease)    a. 02/2015 NSTEMI/PCI: LM nl, LAD 25m (2.75x23 Xience/2.5x8 Xience DES), D2 20ost, LCX 45m, RCA 54m, RPDA1/2 40, EF 45-50.  . Diabetes mellitus without complication (HCC)   . Essential hypertension   . Hyperlipemia   . Ischemic cardiomyopathy    a. 02/2015 Echo: Ef 45-50%, mild apicalanterior HK, sev apical HK, triv MR.   Past Surgical History:  Procedure Laterality Date  . arm surgery     left  . CARDIAC CATHETERIZATION N/A 03/06/2015   Procedure: Left Heart Cath and Coronary Angiography;  Surgeon: Iran Ouch, MD;  Location: [facility] INVASIVE CV LAB;  Service: Cardiovascular;  Laterality: N/A;  . CARDIAC CATHETERIZATION N/A 03/06/2015   Procedure: Coronary Stent Intervention;  Surgeon: Iran Ouch, MD;  Location: [facility] INVASIVE CV LAB;  Service: Cardiovascular;  Laterality: N/A;    Allergies  Allergies  Allergen Reactions  . Bee Venom Anaphylaxis  . Effient [Prasugrel]     Rash    History of Present Illness    Jeff Howard is a 59 y.o. male with a hx of CAD s/p stenting 02/2015, DM 2, HTN, amaurosis fugax of right eye ?TIA, diabetic retinopathy last seen 02/04/2019 by Dr. Kirke Corin.  Presented September 2016 with NSTEMI with borderline anterior ST elevation.  Cardiac cath with 99% mid LAD stenosis and 60% mid left circumflex stenosis.  EF 45 to 50% with distal anterior wall and apical hypokinesis.  Underwent angioplasty and 2 overlapping DES  placement to mid LAD.  A second stent was placed for suspected distal edge dissection.  Recurrent atypical chest pain in thousand 18 for which he underwent treadmill stress test with no evidence of ischemia.  He was able to exercise for 11 minutes and 22 seconds.  He has been previously changed from metoprolol to carvedilol due to underlying bradycardia.  He was admitted 11/04/2019 for sudden onset visual loss involving the right eye.  He had intermittent (15-20 minute) episodes of room right eye vision loss with 4-5 episodes over the course of the day.  CT of head and MRI of brain without acute findings.  CTA showed mild atherosclerotic disease.  Echo with normal LVEF and no evidence of interatrial shunt.  Neurology consulted and recommended LDL goal less than 70.  He was recommended for event monitor to rule atrial fibrillation in setting of suspected TIA.  Present today with his wife. Reports no recurrent vision changes since hospital discharge.   Reports no shortness of breath nor dyspnea on exertion. Reports no chest pain, pressure, or tightness. No edema, orthopnea, PND. Reports no palpitations.   Presently wearing ZIo AT, preliminary review shows NSR and SB. He reports no lightheadedness, dizziness, near syncope, or syncope in the setting of his baseline bradycardia.   EKGs/Labs/Other Studies Reviewed:   The following studies were reviewed today:  Echo bubble study 11/05/2019  1. Left ventricular ejection fraction, by estimation, is 60 to 65%. The  left ventricle  has normal function. The left ventricle has no regional  wall motion abnormalities. Left ventricular diastolic parameters are  consistent with Grade I diastolic  dysfunction (impaired relaxation).   2. Right ventricular systolic function is normal. The right ventricular  size is normal.   3. Mild mitral valve regurgitation.   4. Agitated saline contrast bubble study was negative, with no evidence  of any interatrial shunt.    CT angio neck 11/04/19 IMPRESSION: 1. No emergent finding. No flow limiting stenosis or ulceration seen in the right carotid. 2. Overall mild atherosclerosis in the head and neck, but there is a notable high-grade narrowing at the left vertebral origin.  EKG:  EKG is ordered today.  The ekg ordered today demonstrates SB 51 bpm with no acute St/T wave changes.   Recent Labs: 11/03/2019: ALT 24; BUN 23; Potassium 4.3; Sodium 139 11/04/2019: Creatinine, Ser 0.83; Hemoglobin 13.6; Platelets 190; TSH 3.120  Recent Lipid Panel    Component Value Date/Time   CHOL 140 03/06/2015 0011   TRIG 60 03/06/2015 0011   HDL 52 03/06/2015 0011   CHOLHDL 2.7 03/06/2015 0011   VLDL 12 03/06/2015 0011   LDLCALC 76 03/06/2015 0011    Home Medications   Current Meds  Medication Sig  . aspirin 81 MG tablet Take 81 mg by mouth daily.  Marland Kitchen atorvastatin (LIPITOR) 80 MG tablet Take 1 tablet (80 mg total) by mouth daily at 6 PM.  . carvedilol (COREG) 3.125 MG tablet Take 1 tablet (3.125 mg total) by mouth 2 (two) times daily.  . clopidogrel (PLAVIX) 75 MG tablet Take 1 tablet (75 mg total) by mouth daily.  Marland Kitchen EPINEPHrine 0.3 mg/0.3 mL IJ SOAJ injection Inject 0.3 mLs (0.3 mg total) into the muscle once. Follow package instructions as needed for severe allergy or anaphylactic reaction.  Marland Kitchen lisinopril (ZESTRIL) 5 MG tablet Take 1 tablet (5 mg total) by mouth daily. (Patient taking differently: Take 5 mg by mouth at bedtime. )  . metFORMIN (GLUCOPHAGE-XR) 500 MG 24 hr tablet Take 500 mg by mouth daily with lunch.   . nitroGLYCERIN (NITROSTAT) 0.4 MG SL tablet Place 1 tablet (0.4 mg total) under the tongue every 5 (five) minutes as needed for chest pain.  . pioglitazone (ACTOS) 45 MG tablet Take 45 mg by mouth at bedtime.     Review of Systems       Review of Systems  Constitutional: Negative for chills, fever and malaise/fatigue.  Cardiovascular: Negative for chest pain, dyspnea on exertion, leg swelling,  near-syncope, orthopnea, palpitations and syncope.  Respiratory: Negative for cough, shortness of breath and wheezing.   Gastrointestinal: Negative for nausea and vomiting.  Neurological: Negative for dizziness, light-headedness and weakness.   All other systems reviewed and are otherwise negative except as noted above.  Physical Exam    VS:  BP 110/60 (BP Location: Left Arm, Patient Position: Sitting, Cuff Size: Normal)   Pulse (!) 51   Ht 5\' 5"  (1.651 m)   Wt 173 lb (78.5 kg)   SpO2 98%   BMI 28.79 kg/m  , BMI Body mass index is 28.79 kg/m. GEN: Well nourished, well developed, in no acute distress. HEENT: normal. Neck: Supple, no JVD, carotid bruits, or masses. Cardiac: RRR, no murmurs, rubs, or gallops. No clubbing, cyanosis, edema.  Radials/DP/PT 2+ and equal bilaterally.  Respiratory:  Respirations regular and unlabored, clear to auscultation bilaterally. GI: Soft, nontender, nondistended, BS + x 4. MS: No deformity or atrophy. Skin: Warm and dry, no  rash. Neuro:  Strength and sensation are intact. Psych: Normal affect.  Assessment & Plan    1. CAD - EKG today no acute ST/T wave changes. Reports no anginal symptoms. No indication for ischemic evaluation. GDMT includes aspirin/Plavix, staitn. No beta blocker secondary to baseline bradycardia.   2. HLD, LDL goal less than 70 - 11/18/19 LDL 49. Continue Atorvastatin 80mg  daily.  3. ?TIA - Recent admit with amarosis fugax. Lipids well controlled at goal of <70. Live telemetry monitor presently in place to assess for atrial fibrillation - reviewed preliminary data with no acute findings. CT neck with mild right sided carotid stenosis. Continue to follow with neurology.   4. Preprocedure cardiovascular examination - upcoming screening colonoscopy with Dr. Alice Reichert in September. No indication for additional cardiovascuar testing prior. He is deemed acceptable risk for the procedure. Discussed with Dr. Fletcher Anon, he is okay to hold Plavix  3-5 days prior to colonoscopy at direction of Dr. Alice Reichert. Instructed Mr. Leventhal-Lopez to resume at direction of Dr. Alice Reichert. Aspirin should be continued throughout the periprocedure period. Will forward this note to Dr. Alice Reichert.   Disposition: Follow up in 6 month(s) with Dr. Fletcher Anon or APP.    Loel Dubonnet, NP 11/22/2019, 8:26 PM

## 2019-11-23 ENCOUNTER — Telehealth: Payer: Self-pay | Admitting: Family

## 2019-11-23 NOTE — Telephone Encounter (Signed)
Left voicemail per DPR.   Okay to hold Plavix 3-5 days before upcoming colonoscopy per Dr. Kirke Corin. Office note with information and clearance sent via Epic fax function to Dr. Norma Fredrickson of GI.   Alver Sorrow, NP

## 2019-12-06 ENCOUNTER — Other Ambulatory Visit: Payer: Self-pay

## 2019-12-21 ENCOUNTER — Telehealth: Payer: Self-pay

## 2019-12-21 NOTE — Telephone Encounter (Signed)
Contacted the patient using pacific interpreters. Assisted by interpreter # [phone]. Unable to lmom. Patients voicemail was full.

## 2019-12-21 NOTE — Telephone Encounter (Signed)
-----   Message from Iran Ouch, MD sent at 12/17/2019 12:06 PM EDT ----- No evidence of atrial fibrillation on outpatient monitor.

## 2019-12-28 NOTE — Telephone Encounter (Signed)
Second attempt to contact the patient by phone. Contacted the patient using pacific interpreters. Assisted by interpreter [phone]. Unable to lmom. Patients voicemail was full.

## 2019-12-28 NOTE — Telephone Encounter (Signed)
-----   Message from Muhammad A Arida, MD sent at 12/17/2019 12:06 PM EDT ----- °No evidence of atrial fibrillation on outpatient monitor. ° °

## 2019-12-30 ENCOUNTER — Telehealth: Payer: Self-pay | Admitting: Cardiovascular Disease

## 2019-12-30 NOTE — Telephone Encounter (Signed)
Call received from [facility] with [employer] requesting clarification on the diagnosis for the reason the patient's ZIO monitor was placed.  She states they had requested records previously and were told by our office we didn't see the patient. I confirmed I was unsure as to why they would be told that. I reviewed the patient's chart and advised he was discharged from the hospital [facility]) on 11/05/19 with acute vision loss to the right eye. An event monitor was requested by the hospitalist service for possible TIA.   I did see that 3 attempts were made to the patient to discuss his zio being placed. He came into the office on 11/11/19 and had this done.   Lynden Ang was requesting records on the patient. I faxed the patient's 11/05/19 discharge summary and copy of the 11/05/19 phone note to [place] at [phone].  Confirmation received.

## 2020-01-07 NOTE — Telephone Encounter (Signed)
Letter mailed to the patient. Closing this encounter.

## 2020-02-07 ENCOUNTER — Other Ambulatory Visit: Payer: Self-pay | Admitting: Cardiovascular Disease

## 2020-02-08 ENCOUNTER — Other Ambulatory Visit: Payer: Self-pay | Admitting: Cardiovascular Disease

## 2020-02-17 ENCOUNTER — Other Ambulatory Visit: Admission: RE | Admit: 2020-02-17 | Payer: 59 | Source: Ambulatory Visit

## 2020-02-21 ENCOUNTER — Encounter: Admission: RE | Payer: Self-pay | Source: Ambulatory Visit

## 2020-02-21 ENCOUNTER — Ambulatory Visit: Admission: RE | Admit: 2020-02-21 | Payer: 59 | Source: Ambulatory Visit | Admitting: Internal Medicine

## 2020-02-21 SURGERY — COLONOSCOPY WITH PROPOFOL
Anesthesia: General

## 2020-03-23 ENCOUNTER — Other Ambulatory Visit
Admission: RE | Admit: 2020-03-23 | Discharge: 2020-03-23 | Disposition: A | Discharge status: Home / Self Care | Payer: 59 | Source: Ambulatory Visit | Attending: Gastroenterology | Admitting: Gastroenterology

## 2020-03-23 DIAGNOSIS — Z01812 Encounter for preprocedural laboratory examination: Secondary | ICD-10-CM | POA: Diagnosis not present

## 2020-03-23 DIAGNOSIS — Z20822 Contact with and (suspected) exposure to covid-19: Secondary | ICD-10-CM | POA: Diagnosis not present

## 2020-03-23 LAB — SARS CORONAVIRUS 2 (TAT 6-24 HRS): SARS Coronavirus 2: NEGATIVE

## 2020-03-27 ENCOUNTER — Encounter: Payer: Self-pay | Admitting: *Deleted

## 2020-03-27 ENCOUNTER — Other Ambulatory Visit: Payer: Self-pay

## 2020-03-27 ENCOUNTER — Encounter
Admission: RE | Disposition: A | Discharge status: Home / Self Care | Payer: Self-pay | Source: Home / Self Care | Attending: Gastroenterology

## 2020-03-27 ENCOUNTER — Ambulatory Visit: Payer: 59 | Admitting: Registered Nurse

## 2020-03-27 ENCOUNTER — Ambulatory Visit
Admission: RE | Admit: 2020-03-27 | Discharge: 2020-03-27 | Disposition: A | Discharge status: Home / Self Care | Payer: 59 | Attending: Gastroenterology | Admitting: Gastroenterology

## 2020-03-27 DIAGNOSIS — E785 Hyperlipidemia, unspecified: Secondary | ICD-10-CM | POA: Diagnosis not present

## 2020-03-27 DIAGNOSIS — I252 Old myocardial infarction: Secondary | ICD-10-CM | POA: Insufficient documentation

## 2020-03-27 DIAGNOSIS — Z87892 Personal history of anaphylaxis: Secondary | ICD-10-CM | POA: Insufficient documentation

## 2020-03-27 DIAGNOSIS — I2510 Atherosclerotic heart disease of native coronary artery without angina pectoris: Secondary | ICD-10-CM | POA: Diagnosis not present

## 2020-03-27 DIAGNOSIS — E119 Type 2 diabetes mellitus without complications: Secondary | ICD-10-CM | POA: Diagnosis not present

## 2020-03-27 DIAGNOSIS — K640 First degree hemorrhoids: Secondary | ICD-10-CM | POA: Diagnosis not present

## 2020-03-27 DIAGNOSIS — Z7902 Long term (current) use of antithrombotics/antiplatelets: Secondary | ICD-10-CM | POA: Diagnosis not present

## 2020-03-27 DIAGNOSIS — Z1211 Encounter for screening for malignant neoplasm of colon: Secondary | ICD-10-CM | POA: Diagnosis not present

## 2020-03-27 DIAGNOSIS — Z79899 Other long term (current) drug therapy: Secondary | ICD-10-CM | POA: Insufficient documentation

## 2020-03-27 DIAGNOSIS — Z955 Presence of coronary angioplasty implant and graft: Secondary | ICD-10-CM | POA: Diagnosis not present

## 2020-03-27 DIAGNOSIS — Z7982 Long term (current) use of aspirin: Secondary | ICD-10-CM | POA: Insufficient documentation

## 2020-03-27 DIAGNOSIS — Z91030 Bee allergy status: Secondary | ICD-10-CM | POA: Insufficient documentation

## 2020-03-27 DIAGNOSIS — Z7984 Long term (current) use of oral hypoglycemic drugs: Secondary | ICD-10-CM | POA: Diagnosis not present

## 2020-03-27 DIAGNOSIS — Z888 Allergy status to other drugs, medicaments and biological substances status: Secondary | ICD-10-CM | POA: Insufficient documentation

## 2020-03-27 DIAGNOSIS — I10 Essential (primary) hypertension: Secondary | ICD-10-CM | POA: Diagnosis not present

## 2020-03-27 DIAGNOSIS — I255 Ischemic cardiomyopathy: Secondary | ICD-10-CM | POA: Diagnosis not present

## 2020-03-27 HISTORY — PX: COLONOSCOPY: SHX5424

## 2020-03-27 HISTORY — DX: Acute myocardial infarction, unspecified: I21.9

## 2020-03-27 HISTORY — DX: Angina pectoris, unspecified: I20.9

## 2020-03-27 HISTORY — DX: Dizziness and giddiness: R42

## 2020-03-27 LAB — GLUCOSE, CAPILLARY: Glucose-Capillary: 132 mg/dL — ABNORMAL HIGH (ref 70–99)

## 2020-03-27 SURGERY — COLONOSCOPY
Anesthesia: General

## 2020-03-27 MED ORDER — SODIUM CHLORIDE 0.9 % IV SOLN: INTRAVENOUS | Status: DC

## 2020-03-27 MED ORDER — PROPOFOL 10 MG/ML IV BOLUS
INTRAVENOUS | Status: DC | PRN
  Administered 2020-03-27: 80 mg via INTRAVENOUS

## 2020-03-27 MED ORDER — PROPOFOL 500 MG/50ML IV EMUL
INTRAVENOUS | Status: DC | PRN
  Administered 2020-03-27: 140 ug/kg/min via INTRAVENOUS

## 2020-03-27 NOTE — Anesthesia Preprocedure Evaluation (Signed)
Anesthesia Evaluation  Patient identified by MRN, date of birth, ID band Patient awake    Reviewed: Allergy & Precautions, H&P , NPO status , Patient's Chart, lab work & pertinent test results, reviewed documented beta blocker date and time   Airway Mallampati: II   Neck ROM: full    Dental  (+) Poor Dentition   Pulmonary neg pulmonary ROS,    Pulmonary exam normal        Cardiovascular Exercise Tolerance: Good hypertension, On Medications + angina with exertion + CAD, + Past MI and + Cardiac Stents  Normal cardiovascular exam Rhythm:regular Rate:Normal     Neuro/Psych negative neurological ROS  negative psych ROS   GI/Hepatic negative GI ROS, Neg liver ROS,   Endo/Other  negative endocrine ROSdiabetes  Renal/GU negative Renal ROS  negative genitourinary   Musculoskeletal   Abdominal   Peds  Hematology negative hematology ROS (+)   Anesthesia Other Findings Past Medical History: No date: Anginal pain (HCC) No date: CAD (coronary artery disease)     Comment:  a. 02/2015 NSTEMI/PCI: LM nl, LAD 5m (2.75x23               Xience/2.5x8 Xience DES), D2 20ost, LCX 81m, RCA 55m,               RPDA1/2 40, EF 45-50. No date: Diabetes mellitus without complication (HCC) No date: Dizziness No date: Essential hypertension No date: Hyperlipemia No date: Ischemic cardiomyopathy     Comment:  a. 02/2015 Echo: Ef 45-50%, mild apicalanterior HK, sev               apical HK, triv MR. No date: Myocardial infarction [facility]) Past Surgical History: No date: arm surgery     Comment:  left 03/06/2015: CARDIAC CATHETERIZATION; N/A     Comment:  Procedure: Left Heart Cath and Coronary Angiography;                Surgeon: Iran Ouch, MD;  Location: [facility] INVASIVE               CV LAB;  Service: Cardiovascular;  Laterality: N/A; 03/06/2015: CARDIAC CATHETERIZATION; N/A     Comment:  Procedure: Coronary Stent Intervention;  Surgeon:                Iran Ouch, MD;  Location: [facility] INVASIVE CV LAB;                Service: Cardiovascular;  Laterality: N/A; No date: COLONOSCOPY No date: CORONARY ANGIOPLASTY No date: CORONARY ANGIOPLASTY WITH STENT PLACEMENT     Comment:  x2 BMI    Body Mass Index: 29.87 kg/m     Reproductive/Obstetrics negative OB ROS                             Anesthesia Physical Anesthesia Plan  ASA: III  Anesthesia Plan: General   Post-op Pain Management:    Induction:   PONV Risk Score and Plan:   Airway Management Planned:   Additional Equipment:   Intra-op Plan:   Post-operative Plan:   Informed Consent: I have reviewed the patients History and Physical, chart, labs and discussed the procedure including the risks, benefits and alternatives for the proposed anesthesia with the patient or authorized representative who has indicated his/her understanding and acceptance.     Dental Advisory Given  Plan Discussed with: CRNA  Anesthesia Plan Comments:  Anesthesia Quick Evaluation  

## 2020-03-27 NOTE — Transfer of Care (Signed)
Immediate Anesthesia Transfer of Care Note  Patient: Jeff Howard  Procedure(s) Performed: COLONOSCOPY (N/A )  Patient Location: PACU  Anesthesia Type:General  Level of Consciousness: sedated  Airway & Oxygen Therapy: Patient Spontanous Breathing  Post-op Assessment: Report given to RN and Post -op Vital signs reviewed and stable  Post vital signs: Reviewed and stable  Last Vitals:  Vitals Value Taken Time  BP 126/85 03/27/20 0925  Temp 35.8 C 03/27/20 0914  Pulse 50 03/27/20 0929  Resp 22 03/27/20 0929  SpO2 99 % 03/27/20 0929  Vitals shown include unvalidated device data.  Last Pain:  Vitals:   03/27/20 0924  TempSrc:   PainSc: 0-No pain         Complications: No complications documented.

## 2020-03-27 NOTE — Op Note (Signed)
[facility] Gastroenterology Patient Name: Jeff Howard Procedure Date: 03/27/2020 8:44 AM MRN: [id number] Account #: [id number] Date of Birth: [birth year] Admit Type: Outpatient Age: 59 Room: [facility] ENDO ROOM 4 Gender: Male Note Status: Finalized Procedure:             Colonoscopy Indications:           Screening for colorectal malignant neoplasm Providers:             Andrey Farmer MD, MD Referring MD:          No Local Md, MD (Referring MD) Medicines:             Monitored Anesthesia Care Complications:         No immediate complications. Procedure:             Pre-Anesthesia Assessment:                        - Prior to the procedure, a History and Physical was                         performed, and patient medications and allergies were                         reviewed. The patient is competent. The risks and                         benefits of the procedure and the sedation options and                         risks were discussed with the patient. All questions                         were answered and informed consent was obtained.                         Patient identification and proposed procedure were                         verified by the physician, the nurse, the anesthetist                         and the technician in the endoscopy suite. Mental                         Status Examination: alert and oriented. Airway                         Examination: normal oropharyngeal airway and neck                         mobility. Respiratory Examination: clear to                         auscultation. CV Examination: normal. Prophylactic                         Antibiotics: The patient does not require prophylactic  antibiotics. Prior Anticoagulants: The patient has                         taken no previous anticoagulant or antiplatelet                         agents. ASA Grade Assessment: II - A patient with mild                          systemic disease. After reviewing the risks and                         benefits, the patient was deemed in satisfactory                         condition to undergo the procedure. The anesthesia                         plan was to use monitored anesthesia care (MAC).                         Immediately prior to administration of medications,                         the patient was re-assessed for adequacy to receive                         sedatives. The heart rate, respiratory rate, oxygen                         saturations, blood pressure, adequacy of pulmonary                         ventilation, and response to care were monitored                         throughout the procedure. The physical status of the                         patient was re-assessed after the procedure.                        After obtaining informed consent, the colonoscope was                         passed under direct vision. Throughout the procedure,                         the patient's blood pressure, pulse, and oxygen                         saturations were monitored continuously. The                         Colonoscope was introduced through the anus and                         advanced to the the terminal ileum. The colonoscopy  was performed without difficulty. The patient                         tolerated the procedure well. The quality of the bowel                         preparation was good. Findings:      The perianal and digital rectal examinations were normal.      The terminal ileum appeared normal.      Non-bleeding internal hemorrhoids were found during retroflexion. The       hemorrhoids were Grade I (internal hemorrhoids that do not prolapse).      The exam was otherwise without abnormality on direct and retroflexion       views. Impression:            - The examined portion of the ileum was normal.                        - Non-bleeding internal  hemorrhoids.                        - The examination was otherwise normal on direct and                         retroflexion views.                        - No specimens collected. Recommendation:        - Discharge patient to home.                        - Resume previous diet.                        - Continue present medications.                        - Repeat colonoscopy in 10 years for screening                         purposes.                        - Return to referring physician as previously                         scheduled. Procedure Code(s):     --- Professional ---                        W1027, Colorectal cancer screening; colonoscopy on                         individual not meeting criteria for high risk Diagnosis Code(s):     --- Professional ---                        Z12.11, Encounter for screening for malignant neoplasm                         of colon  K64.0, First degree hemorrhoids CPT copyright 2019 American Medical Association. All rights reserved. The codes documented in this report are preliminary and upon coder review may  be revised to meet current compliance requirements. Andrey Farmer, MD Andrey Farmer MD, MD 03/27/2020 9:13:44 AM Number of Addenda: 0 Note Initiated On: 03/27/2020 8:44 AM Scope Withdrawal Time: 0 hours 7 minutes 53 seconds  Total Procedure Duration: 0 hours 10 minutes 43 seconds  Estimated Blood Loss:  Estimated blood loss: none.      [facility]

## 2020-03-27 NOTE — H&P (Signed)
Outpatient short stay form Pre-procedure 03/27/2020 8:53 AM [name] MD, MPH  Primary Physician: Dr. Perley Jain  Reason for visit:  Screening Colonoscopy  History of present illness:   59 y/o gentleman with history of CAD here for screening colonoscopy. On plavix for CAD with last does 5 days ago. No family history of GI malignancies. No abdominal surgeries.    Current Facility-Administered Medications:  .  0.9 %  sodium chloride infusion, , Intravenous, Continuous, Megin Consalvo, Rossie Muskrat, MD, Last Rate: 20 mL/hr at 03/27/20 0853, Continued from Pre-op at 03/27/20 0853  Medications Prior to Admission  Medication Sig Dispense Refill Last Dose  . carvedilol (COREG) 3.125 MG tablet Take 1 tablet (3.125 mg total) by mouth 2 (two) times daily. PLEASE CONTACT OFFICE FOR APPOINTMENT 60 tablet 2 03/27/2020 at 0700  . sildenafil (REVATIO) 20 MG tablet Take 20 mg by mouth 3 (three) times daily.     Marland Kitchen triamcinolone cream (KENALOG) 0.1 % Apply 1 application topically 2 (two) times daily.     Marland Kitchen aspirin 81 MG tablet Take 81 mg by mouth daily.     Marland Kitchen atorvastatin (LIPITOR) 80 MG tablet Take 1 tablet (80 mg total) by mouth daily at 6 PM. 90 tablet 3 03/25/20  . clopidogrel (PLAVIX) 75 MG tablet TAKE 1 TABLET BY MOUTH EVERY DAY 30 tablet 2 03/22/20  . EPINEPHrine 0.3 mg/0.3 mL IJ SOAJ injection Inject 0.3 mLs (0.3 mg total) into the muscle once. Follow package instructions as needed for severe allergy or anaphylactic reaction. 1 Device 2   . lisinopril (ZESTRIL) 5 MG tablet Take 1 tablet (5 mg total) by mouth at bedtime. PLEASE CONTACT OFFICE FOR FURTHER REFILLS. 30 tablet 2 03/25/20  . metFORMIN (GLUCOPHAGE-XR) 500 MG 24 hr tablet Take 500 mg by mouth daily at 6 PM. Takes 4 tabs   03/25/20  . nitroGLYCERIN (NITROSTAT) 0.4 MG SL tablet Place 1 tablet (0.4 mg total) under the tongue every 5 (five) minutes as needed for chest pain. 25 tablet 3   . pioglitazone (ACTOS) 45 MG tablet Take 45 mg by mouth  at bedtime.    03/25/20     Allergies  Allergen Reactions  . Bee Venom Anaphylaxis  . Effient [Prasugrel]     Rash     Past Medical History:  Diagnosis Date  . Anginal pain (HCC)   . CAD (coronary artery disease)    a. 02/2015 NSTEMI/PCI: LM nl, LAD 7m (2.75x23 Xience/2.5x8 Xience DES), D2 20ost, LCX 42m, RCA 42m, RPDA1/2 40, EF 45-50.  . Diabetes mellitus without complication (HCC)   . Dizziness   . Essential hypertension   . Hyperlipemia   . Ischemic cardiomyopathy    a. 02/2015 Echo: Ef 45-50%, mild apicalanterior HK, sev apical HK, triv MR.  . Myocardial infarction [facility])     Review of systems:  Otherwise negative.    Physical Exam  Gen: Alert, oriented. Appears stated age.  HEENT: [place]/AT. PERRLA. Lungs: No respiratory distress CV: RRR Abd: soft, benign, no masses.  Ext: No edema. Pulses 2+    Planned procedures: Proceed with colonoscopy. The patient understands the nature of the planned procedure, indications, risks, alternatives and potential complications including but not limited to bleeding, infection, perforation, damage to internal organs and possible oversedation/side effects from anesthesia. The patient agrees and gives consent to proceed.  Please refer to procedure notes for findings, recommendations and patient disposition/instructions.     [name] MD, MPH Gastroenterology 03/27/2020  8:53 AM

## 2020-03-28 ENCOUNTER — Encounter: Payer: Self-pay | Admitting: Gastroenterology

## 2020-03-28 NOTE — Anesthesia Postprocedure Evaluation (Signed)
Anesthesia Post Note  Patient: Shylo Dillenbeck  Procedure(s) Performed: COLONOSCOPY (N/A )  Patient location during evaluation: PACU Anesthesia Type: General Level of consciousness: awake and alert Pain management: pain level controlled Vital Signs Assessment: post-procedure vital signs reviewed and stable Respiratory status: spontaneous breathing, nonlabored ventilation, respiratory function stable and patient connected to nasal cannula oxygen Cardiovascular status: blood pressure returned to baseline and stable Postop Assessment: no apparent nausea or vomiting Anesthetic complications: no   No complications documented.   Last Vitals:  Vitals:   03/27/20 0924 03/27/20 0934  BP: 126/85 (!) 141/75  Pulse: 63   Resp: 14   Temp:    SpO2: 97%     Last Pain:  Vitals:   03/28/20 0743  TempSrc:   PainSc: 0-No pain                 Yevette Edwards

## 2020-05-15 ENCOUNTER — Other Ambulatory Visit: Payer: Self-pay | Admitting: Cardiovascular Disease

## 2020-05-18 ENCOUNTER — Other Ambulatory Visit: Payer: Self-pay

## 2020-05-18 ENCOUNTER — Ambulatory Visit: Payer: 59 | Admitting: Cardiovascular Disease

## 2020-05-18 ENCOUNTER — Encounter: Payer: Self-pay | Admitting: Cardiovascular Disease

## 2020-05-18 VITALS — BP 130/70 | HR 51 | Ht 65.0 in | Wt 178.1 lb

## 2020-05-18 DIAGNOSIS — I2510 Atherosclerotic heart disease of native coronary artery without angina pectoris: Secondary | ICD-10-CM | POA: Diagnosis not present

## 2020-05-18 DIAGNOSIS — I255 Ischemic cardiomyopathy: Secondary | ICD-10-CM

## 2020-05-18 DIAGNOSIS — E785 Hyperlipidemia, unspecified: Secondary | ICD-10-CM | POA: Diagnosis not present

## 2020-05-18 NOTE — Patient Instructions (Signed)
Medication Instructions:  Your physician recommends that you continue on your current medications as directed. Please refer to the Current Medication list given to you today.  *If you need a refill on your cardiac medications before your next appointment, please call your pharmacy*   Lab Work: None ordered If you have labs (blood work) drawn today and your tests are completely normal, you will receive your results only by: . MyChart Message (if you have MyChart) OR . A paper copy in the mail If you have any lab test that is abnormal or we need to change your treatment, we will call you to review the results.   Testing/Procedures: None ordered   Follow-Up: At [facility], you and your health needs are our priority.  As part of our continuing mission to provide you with exceptional heart care, we have created designated Provider Care Teams.  These Care Teams include your primary Cardiologist (physician) and Advanced Practice Providers (APPs -  Physician Assistants and Nurse Practitioners) who all work together to provide you with the care you need, when you need it.  We recommend signing up for the patient portal called "MyChart".  Sign up information is provided on this After Visit Summary.  MyChart is used to connect with patients for Virtual Visits (Telemedicine).  Patients are able to view lab/test results, encounter notes, upcoming appointments, etc.  Non-urgent messages can be sent to your provider as well.   To learn more about what you can do with MyChart, go to [email or web address].    Your next appointment:   Your physician wants you to follow-up in: 12 months You will receive a reminder letter in the mail two months in advance. If you don't receive a letter, please call our office to schedule the follow-up appointment.   The format for your next appointment:   In Person  Provider:   You may see Muhammad Arida, MD or one of the following Advanced Practice Providers on  your designated Care Team:    Christopher Berge, NP  Ryan Dunn, PA-C  Jacquelyn Visser, PA-C  Cadence Furth, PA-C  Caitlin Walker, NP    Other Instructions N/A  

## 2020-05-18 NOTE — Progress Notes (Signed)
Cardiology Office Note   Date:  05/18/2020   ID:  Jeff Howard, DOB [birth year], MRN [id number]  PCP:  [name], MD  Cardiologist:   [name], MD   Chief Complaint  Patient presents with  . Other    4- 6 month f/u no complaints today. Meds reviewed verbally with pt.      History of Present Illness: Jeff Howard is a 59 y.o. male who presents for a follow-up visit regarding coronary artery disease . He presented in September 2016 with non-ST elevation myocardial infarction with borderline anterior ST elevation. Cardiac catheterization revealed 99% mid LAD stenosis and 60% mid left circumflex stenosis. Ejection fraction was 45-50% with distal anterior wall and apical hypokinesis. He underwent successful angioplasty and 2 overlapped drug-eluting stent placement to the mid LAD.   He had atypical chest pain in 2018.  He underwent a treadmill stress test which showed no evidence of ischemia.  He was able to exercise for 11 minutes and 22 seconds.  He was hospitalized in May of this year with amaurosis fugax affecting the right eye. MRI of the brain showed no acute changes. Echocardiogram showed normal LV systolic function and negative bubble study. Outpatient ZIO monitor showed no evidence of atrial fibrillation. CTA of the neck showed mild nonobstructive carotid disease and high-grade stenosis at the ostial left vertebral artery.  No recurrent symptoms since then.  He is wondering if the episode was triggered by a neck massage that he received shortly before his neurologic event.  He denies chest pain or shortness of breath.  Past Medical History:  Diagnosis Date  . Anginal pain (HCC)   . CAD (coronary artery disease)    a. 02/2015 NSTEMI/PCI: LM nl, LAD 27m (2.75x23 Xience/2.5x8 Xience DES), D2 20ost, LCX 47m, RCA 46m, RPDA1/2 40, EF 45-50.  . Diabetes mellitus without complication (HCC)   . Dizziness   . Essential hypertension   . Hyperlipemia    . Ischemic cardiomyopathy    a. 02/2015 Echo: Ef 45-50%, mild apicalanterior HK, sev apical HK, triv MR.  . Myocardial infarction [facility])     Past Surgical History:  Procedure Laterality Date  . arm surgery     left  . CARDIAC CATHETERIZATION N/A 03/06/2015   Procedure: Left Heart Cath and Coronary Angiography;  Surgeon: Iran Ouch, MD;  Location: [facility] INVASIVE CV LAB;  Service: Cardiovascular;  Laterality: N/A;  . CARDIAC CATHETERIZATION N/A 03/06/2015   Procedure: Coronary Stent Intervention;  Surgeon: Iran Ouch, MD;  Location: [facility] INVASIVE CV LAB;  Service: Cardiovascular;  Laterality: N/A;  . COLONOSCOPY    . COLONOSCOPY N/A 03/27/2020   Procedure: COLONOSCOPY;  Surgeon: Regis Bill, MD;  Location: [facility] ENDOSCOPY;  Service: Endoscopy;  Laterality: N/A;  . CORONARY ANGIOPLASTY    . CORONARY ANGIOPLASTY WITH STENT PLACEMENT     x2     Current Outpatient Medications  Medication Sig Dispense Refill  . aspirin 81 MG tablet Take 81 mg by mouth daily.    Marland Kitchen atorvastatin (LIPITOR) 80 MG tablet Take 1 tablet (80 mg total) by mouth daily at 6 PM. 90 tablet 3  . carvedilol (COREG) 3.125 MG tablet Take 1 tablet (3.125 mg total) by mouth 2 (two) times daily. 60 tablet 0  . clopidogrel (PLAVIX) 75 MG tablet TAKE 1 TABLET BY MOUTH EVERY DAY 30 tablet 0  . EPINEPHrine 0.3 mg/0.3 mL IJ SOAJ injection Inject 0.3 mLs (0.3 mg total) into the muscle once.  Follow package instructions as needed for severe allergy or anaphylactic reaction. 1 Device 2  . lisinopril (ZESTRIL) 5 MG tablet Take 1 tablet (5 mg total) by mouth at bedtime. 30 tablet 0  . metFORMIN (GLUCOPHAGE-XR) 500 MG 24 hr tablet Take 500 mg by mouth daily at 6 PM. Takes 4 tabs    . nitroGLYCERIN (NITROSTAT) 0.4 MG SL tablet Place 1 tablet (0.4 mg total) under the tongue every 5 (five) minutes as needed for chest pain. 25 tablet 3  . pioglitazone (ACTOS) 45 MG tablet Take 45 mg by mouth at bedtime.     . sildenafil  (REVATIO) 20 MG tablet Take 20 mg by mouth 3 (three) times daily.    Marland Kitchen triamcinolone cream (KENALOG) 0.1 % Apply 1 application topically 2 (two) times daily.     No current facility-administered medications for this visit.    Allergies:   Bee venom and Effient [prasugrel]    Social History:  The patient  reports that he has never smoked. He has never used smokeless tobacco. He reports that he does not drink alcohol and does not use drugs.   Family History:  The patient's family history includes Diabetes in an other family member; Heart attack in his father.    ROS:  Please see the history of present illness.   Otherwise, review of systems are positive for none.   All other systems are reviewed and negative.    PHYSICAL EXAM: VS:  BP 130/70 (BP Location: Left Arm, Patient Position: Sitting, Cuff Size: Normal)   Pulse (!) 51   Ht 5\' 5"  (1.651 m)   Wt 178 lb 2 oz (80.8 kg)   SpO2 99%   BMI 29.64 kg/m  , BMI Body mass index is 29.64 kg/m. GEN: Well nourished, well developed, in no acute distress  HEENT: normal  Neck: no JVD, carotid bruits, or masses Cardiac: RRR; no murmurs, rubs, or gallops,no edema  Respiratory:  clear to auscultation bilaterally, normal work of breathing GI: soft, nontender, nondistended, + BS MS: no deformity or atrophy  Skin: warm and dry, no rash Neuro:  Strength and sensation are intact Psych: euthymic mood, full affect   EKG:  EKG is ordered today. EKG showed sinus bradycardia with no significant ST or T wave changes.  Heart rate is 51 bpm    Recent Labs: 11/03/2019: ALT 24; BUN 23; Potassium 4.3; Sodium 139 11/04/2019: Creatinine, Ser 0.83; Hemoglobin 13.6; Platelets 190; TSH 3.120    Lipid Panel    Component Value Date/Time   CHOL 140 03/06/2015 0011   TRIG 60 03/06/2015 0011   HDL 52 03/06/2015 0011   CHOLHDL 2.7 03/06/2015 0011   VLDL 12 03/06/2015 0011   LDLCALC 76 03/06/2015 0011      Wt Readings from Last 3 Encounters:  05/18/20  178 lb 2 oz (80.8 kg)  03/27/20 174 lb (78.9 kg)  11/22/19 173 lb (78.5 kg)        ASSESSMENT AND PLAN:  1.  Coronary artery disease involving native coronary arteries without angina: He is doing extremely well.   Recommend continuing medical therapy.  Given 2 overlapped stents in the LAD, I favor long-term dual antiplatelet therapy for now.   2. Hyperlipidemia: Continue high dose atorvastatin.  I reviewed his most recent labs done in June which showed an LDL of forty-nine.  3. Ischemic cardiomyopathy: Ejection fraction was 45-50%. He has no symptoms suggestive of volume overload or heart failure.  Continue small dose carvedilol and lisinopril.  4.  TIA: No recurrent symptoms.  Continue treatment of risk factors.   Disposition:   FU with me in 12 months  Signed,  [name], MD  05/18/2020 12:41 PM    [facility]

## 2020-05-31 ENCOUNTER — Other Ambulatory Visit: Payer: Self-pay | Admitting: Cardiovascular Disease

## 2020-07-03 ENCOUNTER — Telehealth: Payer: Self-pay | Admitting: Cardiovascular Disease

## 2020-07-03 NOTE — Telephone Encounter (Signed)
*  STAT* If patient is at the pharmacy, call can be transferred to refill team.   1. Which medications need to be refilled? (please list name of each medication and dose if known) carvedilol   2. Which pharmacy/location (including street and city if local pharmacy) is medication to be sent to? [facility] in [place]  3. Do they need a 30 day or 90 day supply? 30

## 2020-07-04 MED ORDER — CARVEDILOL 3.125 MG PO TABS: 3.1250 mg | ORAL_TABLET | Freq: Two times a day (BID) | ORAL | 5 refills | Status: DC

## 2020-07-04 NOTE — Telephone Encounter (Signed)
Requested Prescriptions   Signed Prescriptions Disp Refills  . carvedilol (COREG) 3.125 MG tablet 60 tablet 5    Sig: Take 1 tablet (3.125 mg total) by mouth 2 (two) times daily.    Authorizing Provider: [name] A    Ordering User: Kendrick Fries

## 2020-10-06 ENCOUNTER — Other Ambulatory Visit: Payer: Self-pay

## 2020-10-06 DIAGNOSIS — Z23 Encounter for immunization: Secondary | ICD-10-CM | POA: Diagnosis not present

## 2020-10-06 DIAGNOSIS — E113299 Type 2 diabetes mellitus with mild nonproliferative diabetic retinopathy without macular edema, unspecified eye: Secondary | ICD-10-CM | POA: Diagnosis not present

## 2020-10-06 DIAGNOSIS — I25118 Atherosclerotic heart disease of native coronary artery with other forms of angina pectoris: Secondary | ICD-10-CM | POA: Diagnosis not present

## 2020-10-06 DIAGNOSIS — I10 Essential (primary) hypertension: Secondary | ICD-10-CM | POA: Diagnosis not present

## 2020-10-06 MED ORDER — ATORVASTATIN CALCIUM 80 MG PO TABS
ORAL_TABLET | ORAL | 3 refills | Status: DC
Start: 2020-10-06 — End: 2022-07-04
  Filled 2020-10-06: qty 90, 90d supply, fill #0
  Filled 2021-01-09: qty 90, 90d supply, fill #1
  Filled 2021-09-11: qty 90, 90d supply, fill #2

## 2020-10-06 MED ORDER — PIOGLITAZONE HCL 45 MG PO TABS
ORAL_TABLET | ORAL | 3 refills | Status: DC
  Filled 2020-10-06: qty 90, 90d supply, fill #0
  Filled 2021-01-09: qty 90, 90d supply, fill #1
  Filled 2021-07-20: qty 90, 90d supply, fill #2

## 2020-10-06 MED ORDER — CARVEDILOL 3.125 MG PO TABS
ORAL_TABLET | ORAL | 3 refills | Status: DC
  Filled 2020-10-06: qty 180, 90d supply, fill #0
  Filled 2021-01-09: qty 180, 90d supply, fill #1
  Filled 2021-05-14: qty 180, 90d supply, fill #2
  Filled 2021-09-11: qty 180, 90d supply, fill #3

## 2020-10-06 MED ORDER — METFORMIN HCL ER 500 MG PO TB24
ORAL_TABLET | ORAL | 3 refills | Status: DC
  Filled 2020-10-06: qty 360, 90d supply, fill #0
  Filled 2021-01-09: qty 360, 90d supply, fill #1
  Filled 2021-09-11: qty 360, 90d supply, fill #2

## 2020-10-06 MED ORDER — LISINOPRIL 5 MG PO TABS
ORAL_TABLET | ORAL | 3 refills | Status: DC
Start: 2020-10-06 — End: 2021-10-17
  Filled 2020-10-06: qty 90, 90d supply, fill #0
  Filled 2021-01-09: qty 90, 90d supply, fill #1
  Filled 2021-07-20: qty 90, 90d supply, fill #2

## 2020-10-06 MED ORDER — CLOPIDOGREL BISULFATE 75 MG PO TABS
ORAL_TABLET | ORAL | 0 refills | Status: DC
  Filled 2020-10-06: qty 90, 90d supply, fill #0

## 2020-10-06 MED ORDER — EPINEPHRINE 0.3 MG/0.3ML IJ SOAJ
INTRAMUSCULAR | 1 refills | Status: DC
  Filled 2020-10-06: qty 2, 20d supply, fill #0

## 2020-10-06 MED ORDER — NITROGLYCERIN 0.4 MG SL SUBL
SUBLINGUAL_TABLET | SUBLINGUAL | 2 refills | Status: AC
  Filled 2020-10-06: qty 25, 7d supply, fill #0
  Filled 2021-09-11: qty 25, 7d supply, fill #1

## 2020-10-06 MED ORDER — CANAGLIFLOZIN 100 MG PO TABS
ORAL_TABLET | ORAL | 11 refills | Status: DC
Start: 2020-10-06 — End: 2021-05-18
  Filled 2020-10-06 – 2020-10-11 (×2): qty 30, 30d supply, fill #0

## 2020-10-09 ENCOUNTER — Other Ambulatory Visit: Payer: Self-pay

## 2020-10-10 ENCOUNTER — Other Ambulatory Visit: Payer: Self-pay

## 2020-10-11 ENCOUNTER — Other Ambulatory Visit: Payer: Self-pay

## 2020-10-11 MED ORDER — EMPAGLIFLOZIN 10 MG PO TABS
10.0000 mg | ORAL_TABLET | ORAL | 9 refills | Status: DC
  Filled 2020-10-11: qty 30, 30d supply, fill #0
  Filled 2020-11-10: qty 30, 30d supply, fill #1
  Filled 2020-12-12: qty 30, 30d supply, fill #2
  Filled 2021-01-09: qty 30, 30d supply, fill #3
  Filled 2021-02-13: qty 30, 30d supply, fill #4
  Filled 2021-03-16: qty 30, 30d supply, fill #5
  Filled 2021-04-16: qty 30, 30d supply, fill #6
  Filled 2021-05-14: qty 30, 30d supply, fill #7

## 2020-11-10 ENCOUNTER — Other Ambulatory Visit: Payer: Self-pay

## 2020-12-12 ENCOUNTER — Other Ambulatory Visit: Payer: Self-pay

## 2021-01-09 ENCOUNTER — Other Ambulatory Visit: Payer: Self-pay

## 2021-01-09 ENCOUNTER — Other Ambulatory Visit: Payer: Self-pay | Admitting: Cardiovascular Disease

## 2021-01-10 ENCOUNTER — Other Ambulatory Visit: Payer: Self-pay

## 2021-01-10 MED FILL — Clopidogrel Bisulfate Tab 75 MG (Base Equiv): ORAL | 90 days supply | Qty: 90 | Fill #0 | Status: AC

## 2021-01-12 DIAGNOSIS — E1159 Type 2 diabetes mellitus with other circulatory complications: Secondary | ICD-10-CM | POA: Diagnosis not present

## 2021-01-12 DIAGNOSIS — E785 Hyperlipidemia, unspecified: Secondary | ICD-10-CM | POA: Diagnosis not present

## 2021-01-12 DIAGNOSIS — I25118 Atherosclerotic heart disease of native coronary artery with other forms of angina pectoris: Secondary | ICD-10-CM | POA: Diagnosis not present

## 2021-01-12 DIAGNOSIS — Z23 Encounter for immunization: Secondary | ICD-10-CM | POA: Diagnosis not present

## 2021-01-12 DIAGNOSIS — I10 Essential (primary) hypertension: Secondary | ICD-10-CM | POA: Diagnosis not present

## 2021-01-12 DIAGNOSIS — E1165 Type 2 diabetes mellitus with hyperglycemia: Secondary | ICD-10-CM | POA: Diagnosis not present

## 2021-02-03 IMAGING — MR MR HEAD W/O CM
11 series · 45 of 48 positions shown · non-contrast
Comparison: None.

CLINICAL DATA: Sudden onset right eye blindness

EXAM:
MRI HEAD WITHOUT CONTRAST
TECHNIQUE: Multiplanar, multiecho pulse sequences of the brain and surrounding
structures were obtained without intravenous contrast.

[Series 5: ax dwi_tracew · axial · 3.0mm · 0.60mm/px · z∈[-55,+99]mm · 4 of 48 slices shown]
[im 1/48]
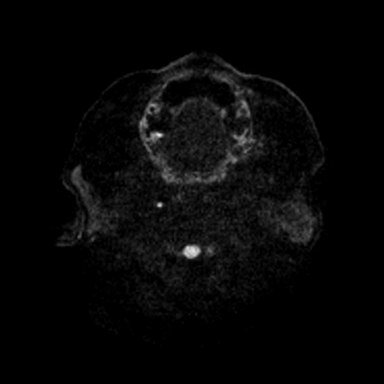
[im 16/48]
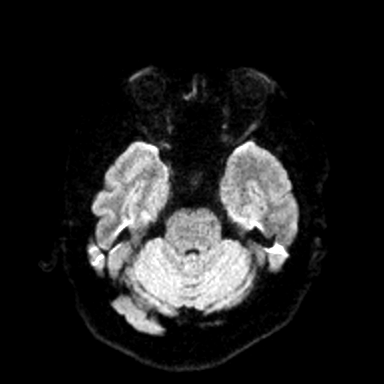
[im 32/48]
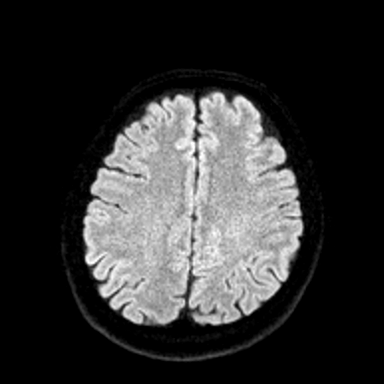
[im 48/48]
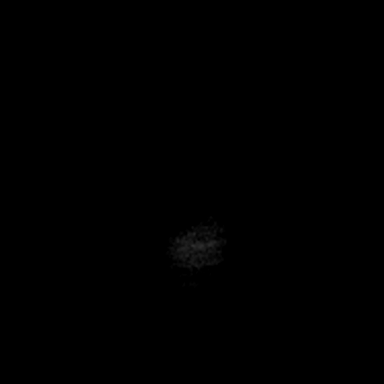

[Series 6: ax dwi_adc · axial · 3.0mm · 0.60mm/px · z∈[-55,+99]mm · 4 of 48 slices shown]
[im 1/48]
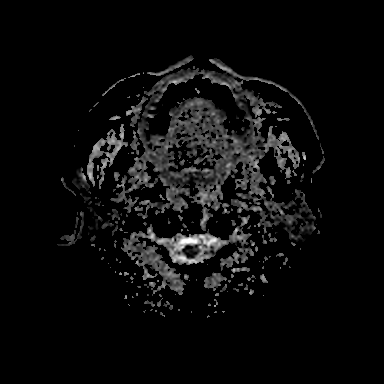
[im 16/48]
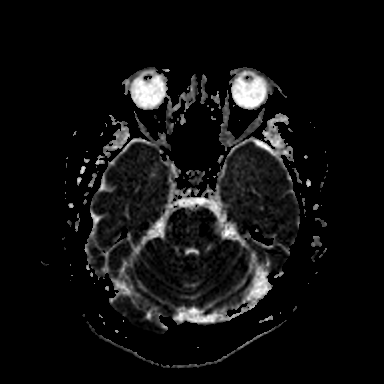
[im 32/48]
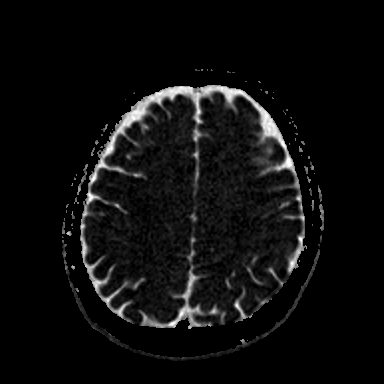
[im 48/48]
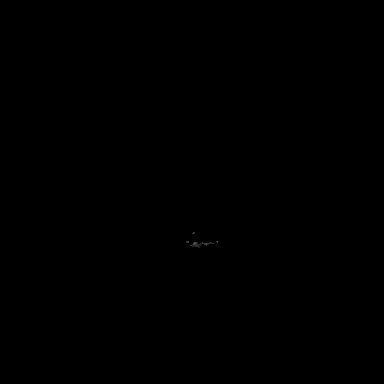

[Series 7: cor dwi_tracew · coronal · 5.0mm · 0.60mm/px · 3 of 36 slices shown]
[im 1/36]
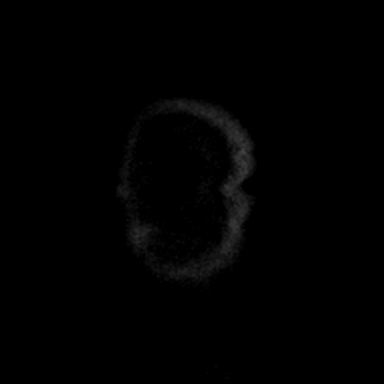
[im 18/36]
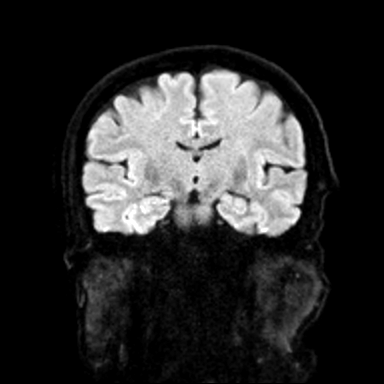
[im 36/36]
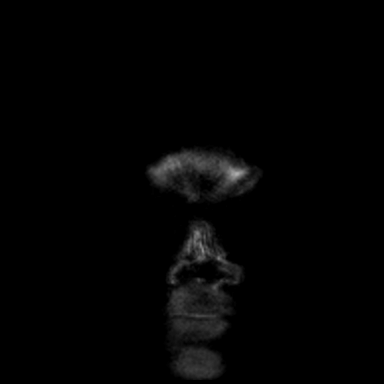

[Series 8: cor dwi_adc · coronal · 5.0mm · 0.60mm/px · 3 of 36 slices shown]
[im 1/36]
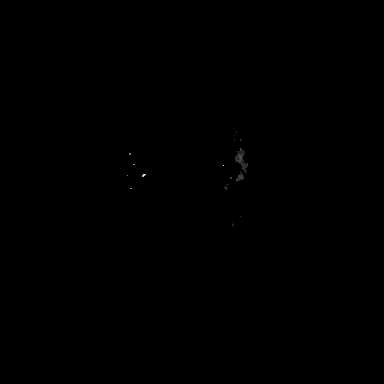
[im 18/36]
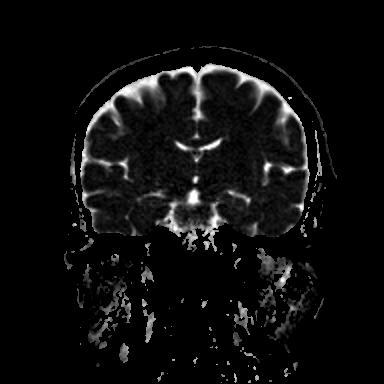
[im 36/36]
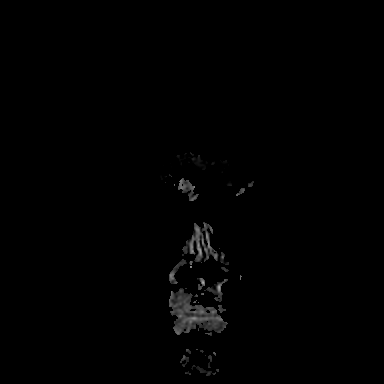

[Series 9: T1 · sagittal · 5.0mm · 0.62mm/px · 2 of 25 slices shown (1 of 2)]
[im 1/25]
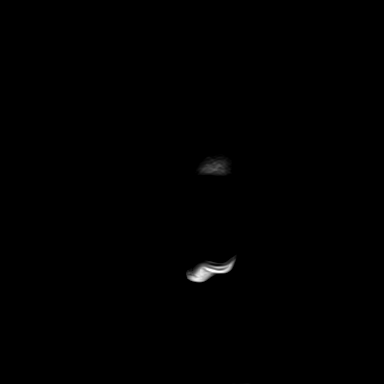
[im 25/25]
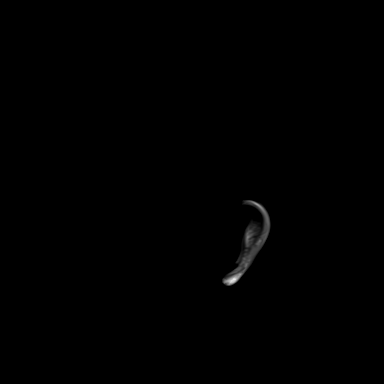

[Series 10: T2 · axial · 5.0mm · 0.53mm/px · z∈[-50,+92]mm · 2 of 25 slices shown (1 of 2)]
[im 1/25]
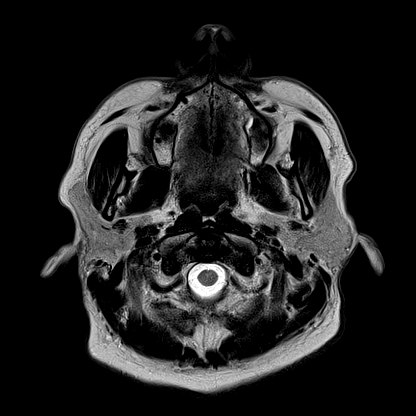
[im 25/25]
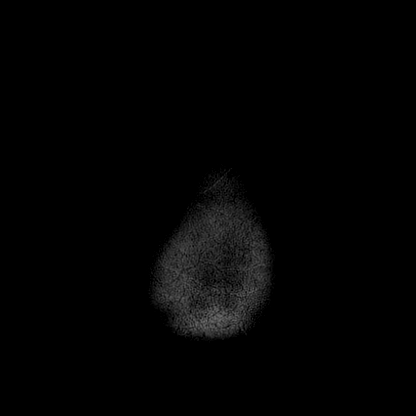

[Series 12: pha_images · axial · 3.0mm · 0.90mm/px · z∈[-66,+108]mm · 5 of 58 slices shown]
[im 1/58]
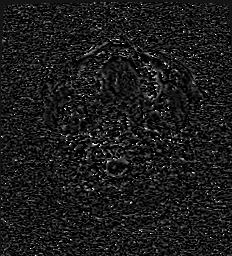
[im 15/58]
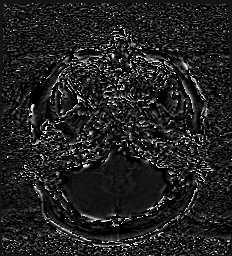
[im 29/58]
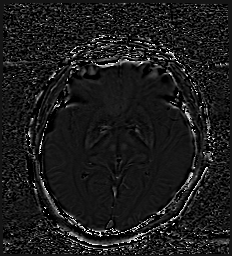
[im 43/58]
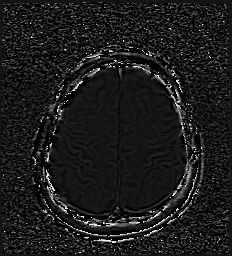
[im 58/58]
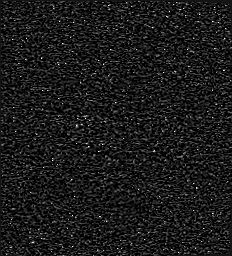

[Series 13: swi_images · axial · 3.0mm · 0.90mm/px · z∈[-66,+108]mm · 5 of 60 slices shown]
[im 1/60]
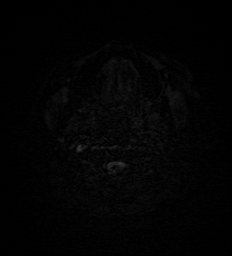
[im 15/60]
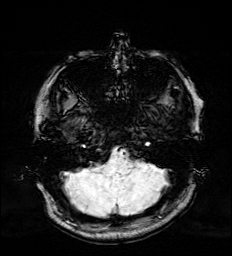
[im 30/60]
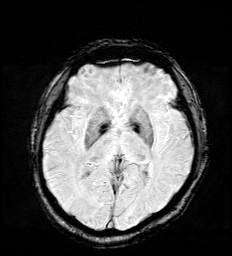
[im 45/60]
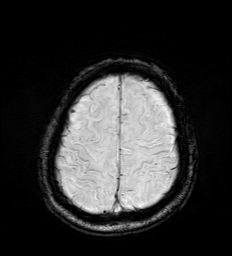
[im 60/60]
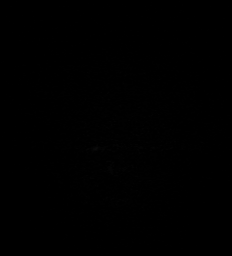

[Series 15: FLAIR · axial · 3.0mm · 0.53mm/px · z∈[-59,+101]mm · 4 of 55 slices shown]
[im 1/55]
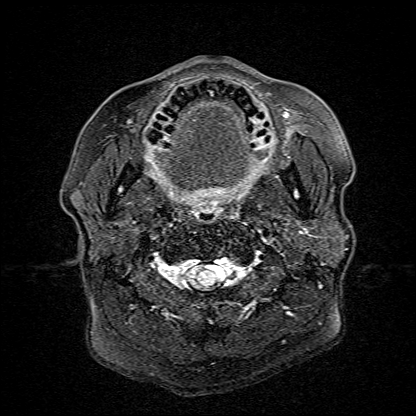
[im 19/55]
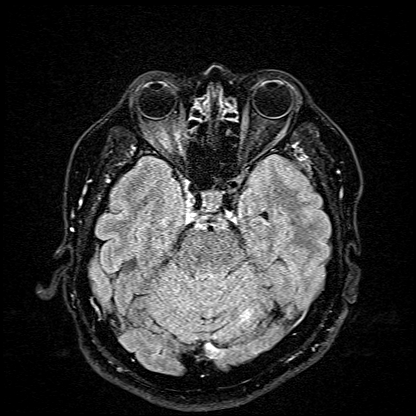
[im 37/55]
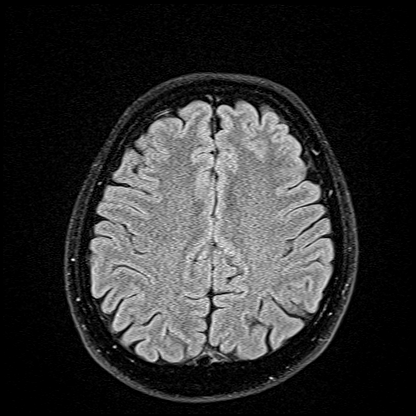
[im 55/55]
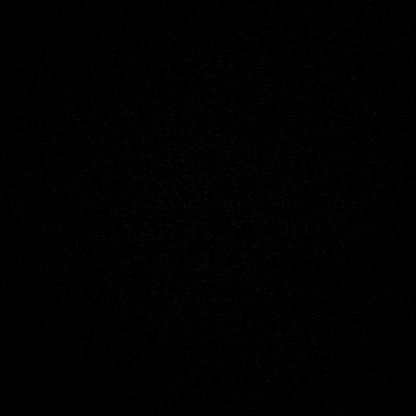

[Series 16: T1 · axial · 1.0mm · 0.98mm/px · z∈[-63,+110]mm · 11 of 176 slices shown (2 of 2)]
[im 1/176]
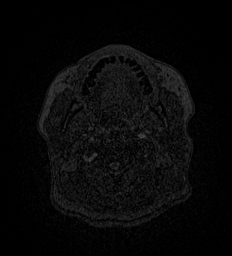
[im 14/176]
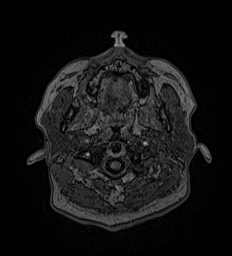
[im 27/176]
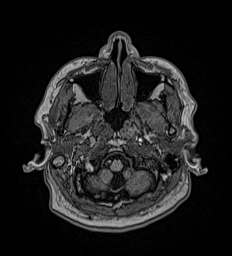
[im 41/176]
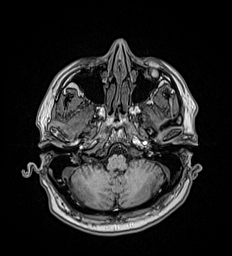
[im 54/176]
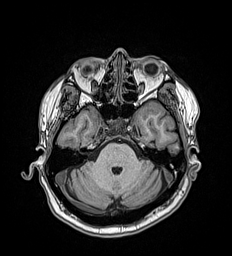
[im 68/176]
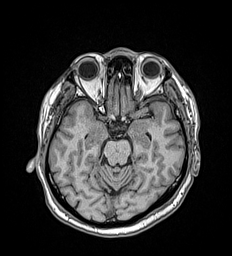
[im 81/176]
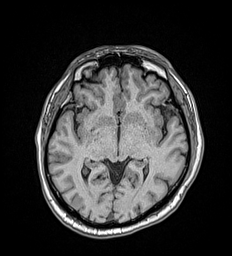
[im 95/176]
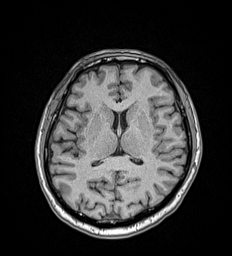
[im 122/176]
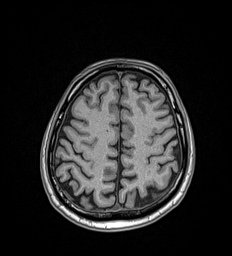
[im 149/176]
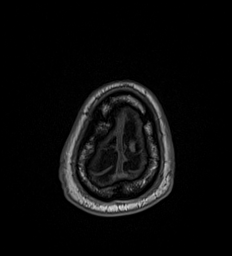
[im 176/176]
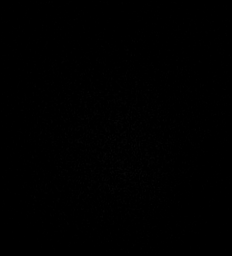

[Series 17: T2 · coronal · 5.0mm · 0.57mm/px · 2 of 29 slices shown (2 of 2)]
[im 1/29]
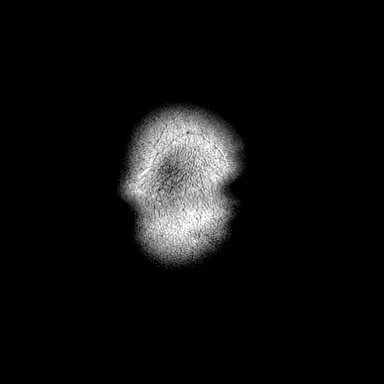
[im 29/29]
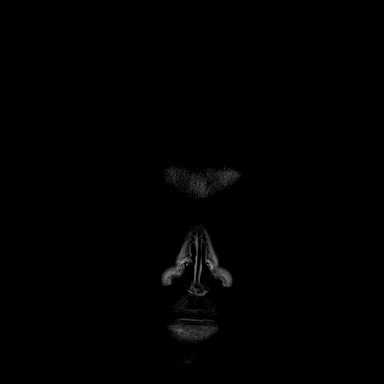

[45 of 48 positions shown; findings below may reference images not displayed]

FINDINGS: BRAIN: No acute infarct, acute hemorrhage or extra-axial collection.
Normal white matter signal. Normal volume of CSF spaces. No chronic
microhemorrhage. Normal midline structures.

VASCULAR: Major flow voids are preserved.

SKULL AND UPPER CERVICAL SPINE: Normal calvarium and skull base.
Visualized upper cervical spine and soft tissues are normal.

SINUSES/ORBITS: No paranasal sinus fluid levels or advanced mucosal
thickening. No mastoid or middle ear effusion. Normal orbits.
IMPRESSION: Normal MRI of the brain.

## 2021-02-03 IMAGING — CT CT ANGIO NECK
2 of 11 series · 7 of 33 positions shown · IV contrast (omnipaque)
Comparison: Brain MRI from earlier today

CLINICAL DATA: Monocular vision loss on the right that is recurring
today.

EXAM:
CT ANGIOGRAPHY HEAD AND NECK
TECHNIQUE: Multidetector CT imaging of the head and neck was performed using
the standard protocol during bolus administration of intravenous
contrast. Multiplanar CT image reconstructions and MIPs were
obtained to evaluate the vascular anatomy. Carotid stenosis
measurements (when applicable) are obtained utilizing NASCET
criteria, using the distal internal carotid diameter as the
denominator.
CONTRAST:  75mL OMNIPAQUE IOHEXOL 350 MG/ML SOLN

[Series 7: cta head neck thins · axial · 0.41mm/px · z∈[-213,+6]mm · 5 of 659 slices shown]
[im 110/659  soft-tissue]
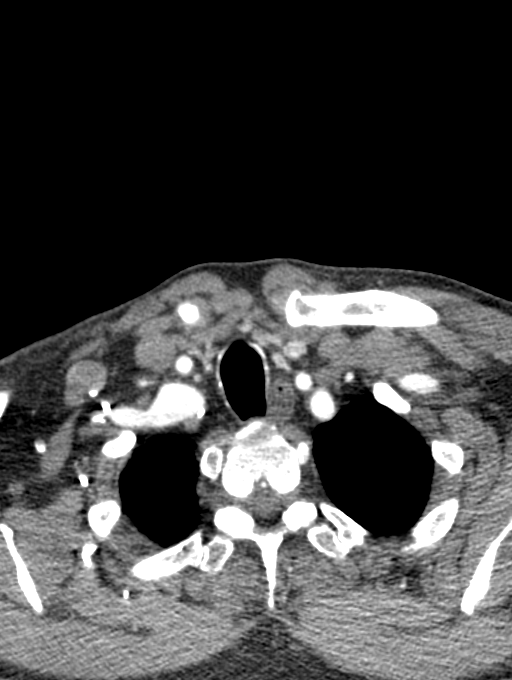
[im 220/659  bone]
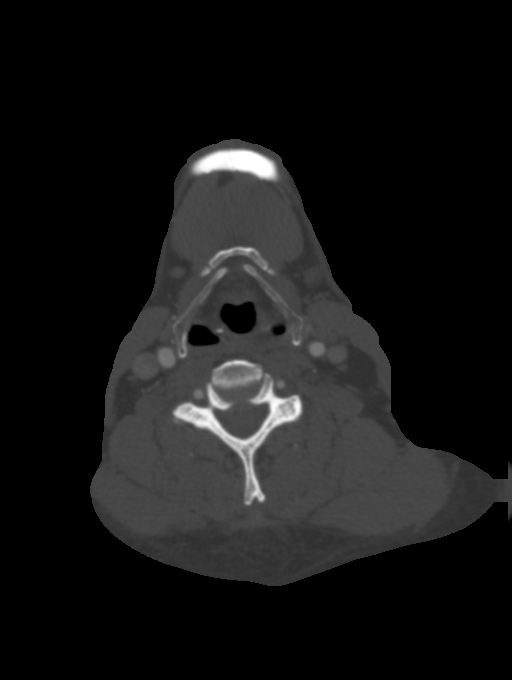
[im 330/659  soft-tissue]
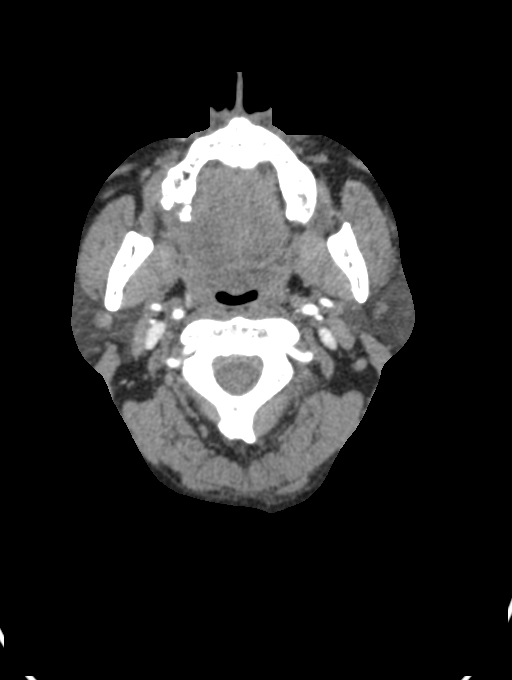
[im 439/659  bone]
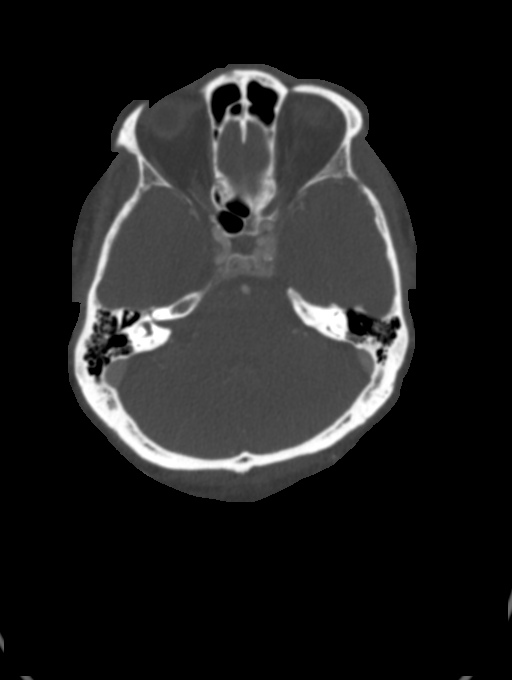
[im 549/659  soft-tissue]
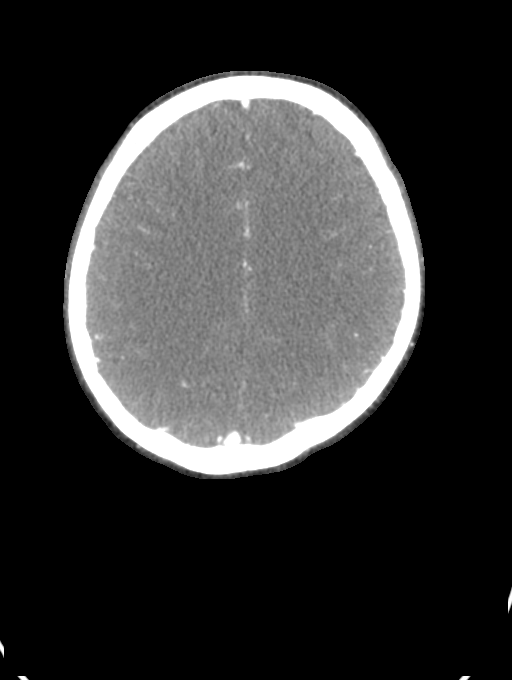

[Series 8: ax thin · axial · 0.38mm/px · z∈[-161,-50]mm · 2 of 333 slices shown]
[im 111/333  soft-tissue]
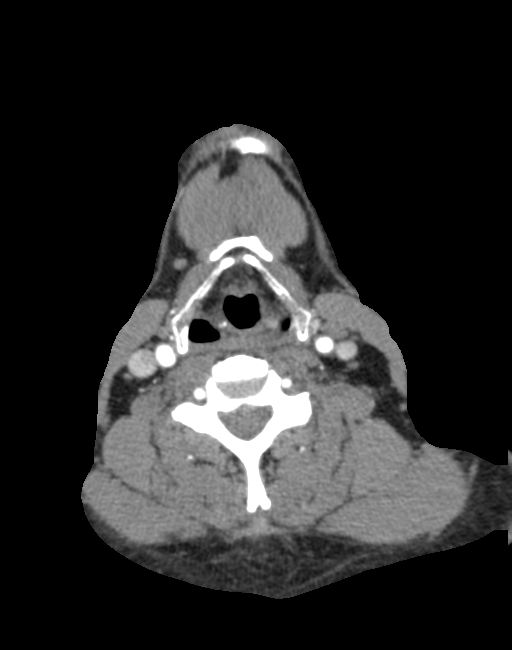
[im 222/333  soft-tissue]
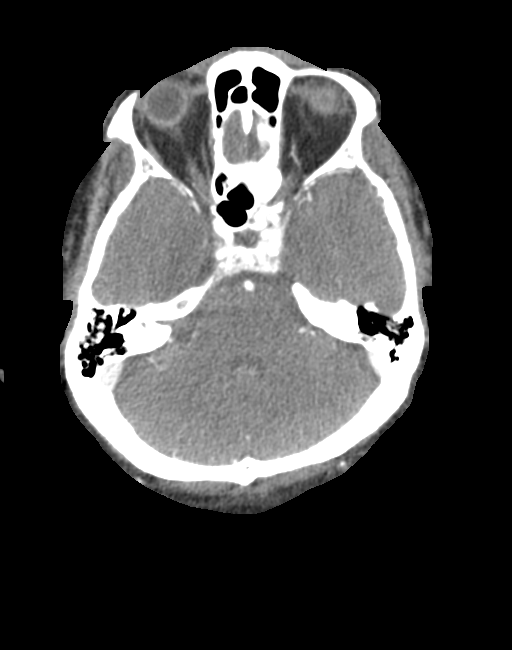

[7 of 33 positions shown; findings below may reference images not displayed]

FINDINGS: CT HEAD FINDINGS

Brain: No evidence of acute infarction, hemorrhage, hydrocephalus,
extra-axial collection or mass lesion/mass effect.

Vascular: See below

Skull: Normal. Negative for fracture or focal lesion.

Sinuses: Negative

Orbits: Negative

Review of the MIP images confirms the above findings

CTA NECK FINDINGS

Aortic arch: Normal with 3 vessel branching.

Right carotid system: Low-density plaque at the distal common
carotid artery. Minimal plaque at the ICA bifurcation. No stenosis
or dissection seen.

Left carotid system: Mild calcified plaque at the bifurcation. No
stenosis or ulceration.

Vertebral arteries: Proximal subclavian atherosclerosis. Mild
calcified plaque at the proximal right vertebral artery. High-grade
narrowing at the left vertebral origin to the degree that luminal
measurement is limited. No dissection or beading.

Skeleton: Negative

Other neck: Negative

Upper chest: Clear apical lungs

Review of the MIP images confirms the above findings

CTA HEAD FINDINGS

Anterior circulation: Calcified plaque on the bilateral carotid
siphons. No branch occlusion, beading, or aneurysm. Both ophthalmic
arteries are symmetrically opacified.

Posterior circulation: The vertebral and basilar arteries are widely
patent. No branch occlusion, beading, or aneurysm.

Venous sinuses: Patent

Anatomic variants: None significant

Review of the MIP images confirms the above findings
IMPRESSION: 1. No emergent finding. No flow limiting stenosis or ulceration seen
in the right carotid.
2. Overall mild atherosclerosis in the head and neck, but there is a
notable high-grade narrowing at the left vertebral origin.

## 2021-02-13 ENCOUNTER — Other Ambulatory Visit: Payer: Self-pay

## 2021-02-22 ENCOUNTER — Ambulatory Visit (INDEPENDENT_AMBULATORY_CARE_PROVIDER_SITE_OTHER)
Admit: 2021-02-22 | Discharge: 2021-02-22 | Disposition: A | Discharge status: Home / Self Care | Payer: 59 | Attending: Family Medicine | Admitting: Family Medicine

## 2021-02-22 ENCOUNTER — Other Ambulatory Visit: Payer: Self-pay

## 2021-02-22 ENCOUNTER — Ambulatory Visit
Admission: EM | Admit: 2021-02-22 | Discharge: 2021-02-22 | Disposition: A | Discharge status: Home / Self Care | Payer: 59 | Attending: Family Medicine | Admitting: Family Medicine

## 2021-02-22 ENCOUNTER — Encounter: Payer: Self-pay | Admitting: Emergency Medicine

## 2021-02-22 DIAGNOSIS — K7689 Other specified diseases of liver: Secondary | ICD-10-CM | POA: Diagnosis not present

## 2021-02-22 DIAGNOSIS — R1032 Left lower quadrant pain: Secondary | ICD-10-CM

## 2021-02-22 DIAGNOSIS — K5730 Diverticulosis of large intestine without perforation or abscess without bleeding: Secondary | ICD-10-CM | POA: Diagnosis not present

## 2021-02-22 DIAGNOSIS — K753 Granulomatous hepatitis, not elsewhere classified: Secondary | ICD-10-CM | POA: Diagnosis not present

## 2021-02-22 DIAGNOSIS — M545 Low back pain, unspecified: Secondary | ICD-10-CM | POA: Diagnosis not present

## 2021-02-22 MED ORDER — NAPROXEN 500 MG PO TABS: 500.0000 mg | ORAL_TABLET | Freq: Two times a day (BID) | ORAL | 0 refills | Status: DC | PRN

## 2021-02-22 NOTE — Discharge Instructions (Signed)
CT was essentially normal.  Medication as prescribed.  Take care  Dr. Adriana Simas

## 2021-02-22 NOTE — ED Provider Notes (Signed)
[facility]    CSN: [id number] Arrival date & time: 02/22/21  0815      History   Chief Complaint Chief Complaint  Patient presents with   Abdominal Pain   HPI  60 year old male presents with abdominal pain.  Patient reports ongoing lower abdominal pain which started on Monday.  He states that it is intermittent.  He states that he currently does not have any significant pain.  He states that the pain often radiates to his left low back.  No constipation.  He had 1 episode of diarrhea on Monday.  No fever.  No urinary symptoms.  No relieving factors.  No other complaints.  Past Medical History:  Diagnosis Date   Anginal pain (HCC)    CAD (coronary artery disease)    a. 02/2015 NSTEMI/PCI: LM nl, LAD 21m (2.75x23 Xience/2.5x8 Xience DES), D2 20ost, LCX 48m, RCA 55m, RPDA1/2 40, EF 45-50.   Diabetes mellitus without complication (HCC)    Dizziness    Essential hypertension    Hyperlipemia    Ischemic cardiomyopathy    a. 02/2015 Echo: Ef 45-50%, mild apicalanterior HK, sev apical HK, triv MR.   Myocardial infarction [facility])     Patient Active Problem List   Diagnosis Date Noted   Acute loss of vision, right 11/04/2019   Sudden loss of vision, right 11/04/2019   Bradycardia 11/04/2019   CAD (coronary artery disease)    Hyperlipemia    Hypertension    Type 2 diabetes mellitus without complication (HCC)    Ischemic cardiomyopathy    Pain in the chest    Angina pectoris (HCC)    Poorly controlled type 2 diabetes mellitus with circulatory disorder (HCC)    Hyperlipidemia    NSTEMI (non-ST elevated myocardial infarction) (HCC) 03/05/2015    Past Surgical History:  Procedure Laterality Date   arm surgery     left   CARDIAC CATHETERIZATION N/A 03/06/2015   Procedure: Left Heart Cath and Coronary Angiography;  Surgeon: Iran Ouch, MD;  Location: [facility] INVASIVE CV LAB;  Service: Cardiovascular;  Laterality: N/A;   CARDIAC CATHETERIZATION N/A 03/06/2015    Procedure: Coronary Stent Intervention;  Surgeon: Iran Ouch, MD;  Location: [facility] INVASIVE CV LAB;  Service: Cardiovascular;  Laterality: N/A;   COLONOSCOPY     COLONOSCOPY N/A 03/27/2020   Procedure: COLONOSCOPY;  Surgeon: Regis Bill, MD;  Location: [facility] ENDOSCOPY;  Service: Endoscopy;  Laterality: N/A;   CORONARY ANGIOPLASTY     CORONARY ANGIOPLASTY WITH STENT PLACEMENT     x2   Home Medications    Prior to Admission medications   Medication Sig Start Date End Date Taking? Authorizing Provider  naproxen (NAPROSYN) 500 MG tablet Take 1 tablet (500 mg total) by mouth 2 (two) times daily as needed for moderate pain. 02/22/21  Yes Nelida Mandarino G, DO  aspirin 81 MG tablet Take 81 mg by mouth daily.    [provider]  atorvastatin (LIPITOR) 80 MG tablet Take 1 tablet (80 mg total) by mouth daily at 6 PM. 02/04/19   Iran Ouch, MD  atorvastatin (LIPITOR) 80 MG tablet take one tablet by mouth daily 10/06/20     canagliflozin (INVOKANA) 100 MG TABS tablet take one tablet by mouth daily every morning before breakfast 10/06/20     carvedilol (COREG) 3.125 MG tablet Take 1 tablet (3.125 mg total) by mouth 2 (two) times daily. 07/04/20   Iran Ouch, MD  carvedilol (COREG) 3.125 MG tablet take  one tablet by mouth twice a day with meals 10/06/20     clopidogrel (PLAVIX) 75 MG tablet TAKE 1 TABLET BY MOUTH EVERY DAY 05/31/20   Iran Ouch, MD  clopidogrel (PLAVIX) 75 MG tablet take one tablet by mouth once daily 01/10/21   Iran Ouch, MD  empagliflozin (JARDIANCE) 10 MG TABS tablet Take 1 tablet (10 mg total) by mouth. 05/26/20     EPINEPHrine 0.3 mg/0.3 mL IJ SOAJ injection Inject 0.3 mLs (0.3 mg total) into the muscle once. Follow package instructions as needed for severe allergy or anaphylactic reaction. 12/22/15   Sharman Cheek, MD  EPINEPHrine 0.3 mg/0.3 mL IJ SOAJ injection inject 0.3 ml into the muscle once as needed for anaphylaxis 10/06/20      lisinopril (ZESTRIL) 5 MG tablet TAKE 1 TABLET BY MOUTH EVERYDAY AT BEDTIME 05/31/20   Iran Ouch, MD  lisinopril (ZESTRIL) 5 MG tablet take one tablet by mouth daily 10/06/20     metFORMIN (GLUCOPHAGE-XR) 500 MG 24 hr tablet Take 500 mg by mouth daily at 6 PM. Takes 4 tabs    [provider]  metFORMIN (GLUCOPHAGE-XR) 500 MG 24 hr tablet take 4 tablets by mouth daily with dinner 10/06/20     nitroGLYCERIN (NITROSTAT) 0.4 MG SL tablet Place 1 tablet (0.4 mg total) under the tongue every 5 (five) minutes as needed for chest pain. 03/24/15   Creig Hines, NP  nitroGLYCERIN (NITROSTAT) 0.4 MG SL tablet Place one tablet under the tongue every 5 minutes as needed for chest pain 10/06/20     pioglitazone (ACTOS) 45 MG tablet Take 45 mg by mouth at bedtime.     [provider]  pioglitazone (ACTOS) 45 MG tablet take one tablet by mouth daily 10/06/20     sildenafil (REVATIO) 20 MG tablet Take 20 mg by mouth 3 (three) times daily.    [provider]  triamcinolone cream (KENALOG) 0.1 % Apply 1 application topically 2 (two) times daily.    [provider]    Family History Family History  Problem Relation Age of Onset   Heart attack Father    Diabetes Other     Social History Social History   Tobacco Use   Smoking status: Never   Smokeless tobacco: Never  Vaping Use   Vaping Use: Never used  Substance Use Topics   Alcohol use: No   Drug use: No     Allergies   Bee venom and Effient [prasugrel]   Review of Systems Review of Systems Per HPI  Physical Exam Triage Vital Signs ED Triage Vitals  Enc Vitals Group     BP 02/22/21 0839 (!) 156/96     Pulse Rate 02/22/21 0839 62     Resp 02/22/21 0839 18     Temp 02/22/21 0839 98.6 F (37 C)     Temp Source 02/22/21 0839 Oral     SpO2 02/22/21 0839 100 %     Weight --      Height --      Head Circumference --      Peak Flow --      Pain Score 02/22/21 0840 0     Pain Loc --       Pain Edu? --      Excl. in GC? --    Updated Vital Signs BP (!) 156/96 (BP Location: Left Arm)   Pulse 62   Temp 98.6 F (37 C) (Oral)   Resp 18  SpO2 100%   Visual Acuity Right Eye Distance:   Left Eye Distance:   Bilateral Distance:    Right Eye Near:   Left Eye Near:    Bilateral Near:     Physical Exam Vitals and nursing note reviewed.  Constitutional:      General: He is not in acute distress.    Appearance: He is well-developed. He is not ill-appearing.  HENT:     Head: Normocephalic and atraumatic.  Eyes:     General:        Right eye: No discharge.        Left eye: No discharge.     Conjunctiva/sclera: Conjunctivae normal.  Abdominal:     General: There is no distension.     Palpations: Abdomen is soft.     Comments: Mild tenderness in left lower quadrant.  Neurological:     Mental Status: He is alert.     UC Treatments / Results  Labs (all labs ordered are listed, but only abnormal results are displayed) Labs Reviewed - No data to display  EKG   Radiology CT ABDOMEN PELVIS WO CONTRAST  Result Date: 02/22/2021 CLINICAL DATA:  Mid lower abdominal pain that radiates to left lower back since Monday. Diverticulitis suspected. EXAM: CT ABDOMEN AND PELVIS WITHOUT CONTRAST TECHNIQUE: Multidetector CT imaging of the abdomen and pelvis was performed following the standard protocol without IV contrast. COMPARISON:  None. FINDINGS: Lower chest: No acute abnormality.  Coronary artery calcifications. Hepatobiliary: Punctate calcified granuloma in the inferior aspect of the right lobe of the liver. Otherwise, unremarkable noncontrast appearance of the hepatic parenchyma. Gallbladder is unremarkable. No biliary ductal dilation. Pancreas: No pancreatic ductal dilatation or surrounding inflammatory changes. Spleen: Within normal limits. Adrenals/Urinary Tract: Bilateral adrenal glands are unremarkable. No hydronephrosis. No renal, ureteral or bladder calculi  visualized. Urinary bladder is unremarkable for degree of distension. Stomach/Bowel: Stomach is unremarkable for degree of distension. No pathologic dilation of small or large bowel. The appendix is not definitely visualized however there is no pericecal inflammation. Terminal ileum is within normal limits. Colonic diverticulosis without findings of acute diverticulitis. Vascular/Lymphatic: Aortic and branch vessel atherosclerosis without abdominal aortic aneurysm. No pathologically enlarged abdominal or pelvic lymph nodes. Reproductive: Prostate is unremarkable. Other: No significant abdominopelvic ascites. Musculoskeletal: Multilevel degenerative changes spine. No acute osseous abnormality. IMPRESSION: 1. No acute findings in the abdomen or pelvis. 2. Colonic diverticulosis without findings of acute diverticulitis. 3. Aortic atherosclerosis. Aortic Atherosclerosis (ICD10-I70.0). Electronically Signed   By: Maudry Mayhew M.D.   On: 02/22/2021 10:25    Procedures Procedures (including critical care time)  Medications Ordered in UC Medications - No data to display  Initial Impression / Assessment and Plan / UC Course  I have reviewed the triage vital signs and the nursing notes.  Pertinent labs & imaging results that were available during my care of the patient were reviewed by me and considered in my medical decision making (see chart for details).    60 year old male presents with ongoing lower abdominal pain.  CT scan was obtained to evaluate for underlying pathology particularly stone versus diverticulitis.  CT was intimately interpreted by me.  No acute intra-abdominal pathology.  No evidence of diverticulitis or kidney stone.  Naproxen as directed.  Supportive care.  Final Clinical Impressions(s) / UC Diagnoses   Final diagnoses:  LLQ pain     Discharge Instructions      CT was essentially normal.  Medication as prescribed.  Take care  Dr. Adriana Simas  ED Prescriptions      Medication Sig Dispense Auth. Provider   naproxen (NAPROSYN) 500 MG tablet Take 1 tablet (500 mg total) by mouth 2 (two) times daily as needed for moderate pain. 20 tablet Tommie Sams, DO      PDMP not reviewed this encounter.   Tommie Sams, [place] 02/22/21 1148

## 2021-02-22 NOTE — ED Triage Notes (Signed)
Pt presents today with c/o of mid lower abdominal pain that radiates to left lower back that began on Monday. He does report diarrhea on Monday. LBM 02/21/21 "normal. Denies n/v. Denies dysuria.

## 2021-03-16 ENCOUNTER — Other Ambulatory Visit: Payer: Self-pay

## 2021-04-16 ENCOUNTER — Other Ambulatory Visit: Payer: Self-pay | Admitting: Cardiovascular Disease

## 2021-04-16 ENCOUNTER — Other Ambulatory Visit: Payer: Self-pay

## 2021-04-16 MED FILL — Clopidogrel Bisulfate Tab 75 MG (Base Equiv): ORAL | 90 days supply | Qty: 90 | Fill #0 | Status: AC

## 2021-04-20 DIAGNOSIS — E1165 Type 2 diabetes mellitus with hyperglycemia: Secondary | ICD-10-CM | POA: Diagnosis not present

## 2021-04-20 DIAGNOSIS — I10 Essential (primary) hypertension: Secondary | ICD-10-CM | POA: Diagnosis not present

## 2021-04-20 DIAGNOSIS — Z23 Encounter for immunization: Secondary | ICD-10-CM | POA: Diagnosis not present

## 2021-04-20 DIAGNOSIS — E1159 Type 2 diabetes mellitus with other circulatory complications: Secondary | ICD-10-CM | POA: Diagnosis not present

## 2021-04-20 DIAGNOSIS — I25118 Atherosclerotic heart disease of native coronary artery with other forms of angina pectoris: Secondary | ICD-10-CM | POA: Diagnosis not present

## 2021-05-14 ENCOUNTER — Other Ambulatory Visit: Payer: Self-pay

## 2021-05-18 ENCOUNTER — Ambulatory Visit (INDEPENDENT_AMBULATORY_CARE_PROVIDER_SITE_OTHER): Payer: 59 | Admitting: Cardiovascular Disease

## 2021-05-18 ENCOUNTER — Encounter: Payer: Self-pay | Admitting: Cardiovascular Disease

## 2021-05-18 ENCOUNTER — Other Ambulatory Visit: Payer: Self-pay

## 2021-05-18 VITALS — BP 126/64 | HR 52 | Ht 66.0 in | Wt 171.0 lb

## 2021-05-18 DIAGNOSIS — G459 Transient cerebral ischemic attack, unspecified: Secondary | ICD-10-CM

## 2021-05-18 DIAGNOSIS — I2510 Atherosclerotic heart disease of native coronary artery without angina pectoris: Secondary | ICD-10-CM

## 2021-05-18 DIAGNOSIS — I255 Ischemic cardiomyopathy: Secondary | ICD-10-CM

## 2021-05-18 DIAGNOSIS — E785 Hyperlipidemia, unspecified: Secondary | ICD-10-CM | POA: Diagnosis not present

## 2021-05-18 NOTE — Progress Notes (Signed)
Cardiology Office Note   Date:  05/18/2021   ID:  Jeff Howard, DOB 1960/06/15, MRN [id number]  PCP:  [name], MD  Cardiologist:   [name], MD   Chief Complaint  Patient presents with   Other    12 Month f/u no complaints today. Meds reviewed verbally with pt.      History of Present Illness: Jeff Howard is a 60 y.o. male who presents for a follow-up visit regarding coronary artery disease . He presented in September 2016 with non-ST elevation myocardial infarction with borderline anterior ST elevation. Cardiac catheterization revealed 99% mid LAD stenosis and 60% mid left circumflex stenosis. Ejection fraction was 45-50% with distal anterior wall and apical hypokinesis. He underwent successful angioplasty and 2 overlapped drug-eluting stent placement to the mid LAD.   He had atypical chest pain in 2018.  He underwent a treadmill stress test which showed no evidence of ischemia.  He was able to exercise for 11 minutes and 22 seconds. He has chronic medical conditions including type 2 diabetes, essential hypertension and hyperlipidemia.  He was hospitalized in May of 2021  with amaurosis fugax affecting the right eye. MRI of the brain showed no acute changes. Echocardiogram showed normal LV systolic function and negative bubble study. Outpatient ZIO monitor showed no evidence of atrial fibrillation. CTA of the neck showed mild nonobstructive carotid disease and high-grade stenosis at the ostial left vertebral artery.  No recurrent symptoms since then.   He has been doing extremely well with no recent chest pain, shortness of breath or palpitations.  He takes his medications regularly with improvement in glycemic control.  Past Medical History:  Diagnosis Date   Anginal pain (HCC)    CAD (coronary artery disease)    a. 02/2015 NSTEMI/PCI: LM nl, LAD 58m (2.75x23 Xience/2.5x8 Xience DES), D2 20ost, LCX 35m, RCA 71m, RPDA1/2 40, EF 45-50.   Diabetes  mellitus without complication (HCC)    Dizziness    Essential hypertension    Hyperlipemia    Ischemic cardiomyopathy    a. 02/2015 Echo: Ef 45-50%, mild apicalanterior HK, sev apical HK, triv MR.   Myocardial infarction [facility])     Past Surgical History:  Procedure Laterality Date   arm surgery     left   CARDIAC CATHETERIZATION N/A 03/06/2015   Procedure: Left Heart Cath and Coronary Angiography;  Surgeon: Iran Ouch, MD;  Location: [facility] INVASIVE CV LAB;  Service: Cardiovascular;  Laterality: N/A;   CARDIAC CATHETERIZATION N/A 03/06/2015   Procedure: Coronary Stent Intervention;  Surgeon: Iran Ouch, MD;  Location: [facility] INVASIVE CV LAB;  Service: Cardiovascular;  Laterality: N/A;   COLONOSCOPY     COLONOSCOPY N/A 03/27/2020   Procedure: COLONOSCOPY;  Surgeon: Regis Bill, MD;  Location: [facility] ENDOSCOPY;  Service: Endoscopy;  Laterality: N/A;   CORONARY ANGIOPLASTY     CORONARY ANGIOPLASTY WITH STENT PLACEMENT     x2     Current Outpatient Medications  Medication Sig Dispense Refill   atorvastatin (LIPITOR) 80 MG tablet take one tablet by mouth daily 90 tablet 3   carvedilol (COREG) 3.125 MG tablet take one tablet by mouth twice a day with meals 180 tablet 3   clopidogrel (PLAVIX) 75 MG tablet Take one tablet by mouth once daily 90 tablet 0   empagliflozin (JARDIANCE) 10 MG TABS tablet Take 1 tablet (10 mg total) by mouth. 30 tablet 9   EPINEPHrine 0.3 mg/0.3 mL IJ SOAJ injection inject 0.3 ml into  the muscle once as needed for anaphylaxis 2 each 1   lisinopril (ZESTRIL) 5 MG tablet take one tablet by mouth daily 90 tablet 3   metFORMIN (GLUCOPHAGE-XR) 500 MG 24 hr tablet take 4 tablets by mouth daily with dinner 360 tablet 3   nitroGLYCERIN (NITROSTAT) 0.4 MG SL tablet Place one tablet under the tongue every 5 minutes as needed for chest pain 25 tablet 2   pioglitazone (ACTOS) 45 MG tablet take one tablet by mouth daily 90 tablet 3   No current  facility-administered medications for this visit.    Allergies:   Bee venom and Effient [prasugrel]    Social History:  The patient  reports that he has never smoked. He has never used smokeless tobacco. He reports that he does not drink alcohol and does not use drugs.   Family History:  The patient's family history includes Diabetes in an other family member; Heart attack in his father.    ROS:  Please see the history of present illness.   Otherwise, review of systems are positive for none.   All other systems are reviewed and negative.    PHYSICAL EXAM: VS:  BP 126/64 (BP Location: Left Arm, Patient Position: Sitting, Cuff Size: Normal)   Pulse (!) 52   Ht 5\' 6"  (1.676 m)   Wt 171 lb (77.6 kg)   SpO2 98%   BMI 27.60 kg/m  , BMI Body mass index is 27.6 kg/m. GEN: Well nourished, well developed, in no acute distress  HEENT: normal  Neck: no JVD, carotid bruits, or masses Cardiac: RRR; no murmurs, rubs, or gallops,no edema  Respiratory:  clear to auscultation bilaterally, normal work of breathing GI: soft, nontender, nondistended, + BS MS: no deformity or atrophy  Skin: warm and dry, no rash Neuro:  Strength and sensation are intact Psych: euthymic mood, full affect   EKG:  EKG is ordered today. EKG showed sinus bradycardia with no significant ST or T wave changes.  Heart rate is 52 bpm    Recent Labs: No results found for requested labs within last 8760 hours.    Lipid Panel    Component Value Date/Time   CHOL 140 03/06/2015 0011   TRIG 60 03/06/2015 0011   HDL 52 03/06/2015 0011   CHOLHDL 2.7 03/06/2015 0011   VLDL 12 03/06/2015 0011   LDLCALC 76 03/06/2015 0011      Wt Readings from Last 3 Encounters:  05/18/21 171 lb (77.6 kg)  05/18/20 178 lb 2 oz (80.8 kg)  03/27/20 174 lb (78.9 kg)        ASSESSMENT AND PLAN:  1.  Coronary artery disease involving native coronary arteries without angina: He is doing extremely well.   Recommend continuing  medical therapy.  I elected to discontinue aspirin today and continue clopidogrel long-term given his previous TIA.   2. Hyperlipidemia: I reviewed most recent lipid profile done in August which showed an LDL of 68.  Continue high-dose atorvastatin 80 mg daily.  3. Ischemic cardiomyopathy: Ejection fraction was 45-50%. He has no symptoms suggestive of volume overload or heart failure.  Continue small dose carvedilol and lisinopril.    4.  TIA: No recurrent symptoms.  Continue clopidogrel long-term.  5.  Type 2 diabetes: Gradual improvement with most recent hemoglobin A1c of 6.7.  He is on Jardiance which should improve his cardiovascular risk.   Disposition:   FU with me in 12 months  Signed,  September, MD  05/18/2021 10:59 AM  [facility]

## 2021-05-18 NOTE — Patient Instructions (Signed)
Medication Instructions:   STOP taking aspirin 81 mg   Continue all other medications as ordered  *If you need a refill on your cardiac medications before your next appointment, please call your pharmacy*   Lab Work: None ordered If you have labs (blood work) drawn today and your tests are completely normal, you will receive your results only by: MyChart Message (if you have MyChart) OR A paper copy in the mail If you have any lab test that is abnormal or we need to change your treatment, we will call you to review the results.   Testing/Procedures: None ordered   Follow-Up: At [facility], you and your health needs are our priority.  As part of our continuing mission to provide you with exceptional heart care, we have created designated Provider Care Teams.  These Care Teams include your primary Cardiologist (physician) and Advanced Practice Providers (APPs -  Physician Assistants and Nurse Practitioners) who all work together to provide you with the care you need, when you need it.  We recommend signing up for the patient portal called "MyChart".  Sign up information is provided on this After Visit Summary.  MyChart is used to connect with patients for Virtual Visits (Telemedicine).  Patients are able to view lab/test results, encounter notes, upcoming appointments, etc.  Non-urgent messages can be sent to your provider as well.   To learn more about what you can do with MyChart, go to [email or web address].    Your next appointment:   Your physician wants you to follow-up in: 1 year. You will receive a reminder letter in the mail two months in advance. If you don't receive a letter, please call our office to schedule the follow-up appointment.   The format for your next appointment:   In Person  Provider:   You may see [name], MD or one of the following Advanced Practice Providers on your designated Care Team:   Nicolasa Ducking, NP Eula Listen, PA-C Cadence Fransico Michael,  PA-C1}    Other Instructions N/A

## 2021-06-15 ENCOUNTER — Other Ambulatory Visit: Payer: Self-pay

## 2021-06-15 MED ORDER — JARDIANCE 10 MG PO TABS
ORAL_TABLET | ORAL | 11 refills | Status: AC
  Filled 2021-06-15: qty 90, 90d supply, fill #0
  Filled 2021-09-11: qty 90, 90d supply, fill #1
  Filled 2021-12-13: qty 90, 90d supply, fill #2

## 2021-06-20 DIAGNOSIS — R059 Cough, unspecified: Secondary | ICD-10-CM | POA: Diagnosis not present

## 2021-06-20 DIAGNOSIS — R509 Fever, unspecified: Secondary | ICD-10-CM | POA: Diagnosis not present

## 2021-06-20 DIAGNOSIS — Z20822 Contact with and (suspected) exposure to covid-19: Secondary | ICD-10-CM | POA: Diagnosis not present

## 2021-07-20 ENCOUNTER — Other Ambulatory Visit: Payer: Self-pay

## 2021-07-20 ENCOUNTER — Other Ambulatory Visit: Payer: Self-pay | Admitting: Cardiovascular Disease

## 2021-07-20 MED ORDER — CLOPIDOGREL BISULFATE 75 MG PO TABS
ORAL_TABLET | ORAL | 2 refills | Status: DC
  Filled 2021-07-20: qty 90, 90d supply, fill #0
  Filled 2021-10-15: qty 90, 90d supply, fill #1

## 2021-08-15 DIAGNOSIS — H35033 Hypertensive retinopathy, bilateral: Secondary | ICD-10-CM | POA: Diagnosis not present

## 2021-08-24 DIAGNOSIS — I25118 Atherosclerotic heart disease of native coronary artery with other forms of angina pectoris: Secondary | ICD-10-CM | POA: Diagnosis not present

## 2021-08-24 DIAGNOSIS — E1159 Type 2 diabetes mellitus with other circulatory complications: Secondary | ICD-10-CM | POA: Diagnosis not present

## 2021-08-24 DIAGNOSIS — I10 Essential (primary) hypertension: Secondary | ICD-10-CM | POA: Diagnosis not present

## 2021-08-24 DIAGNOSIS — E1165 Type 2 diabetes mellitus with hyperglycemia: Secondary | ICD-10-CM | POA: Diagnosis not present

## 2021-09-11 ENCOUNTER — Other Ambulatory Visit: Payer: Self-pay

## 2021-09-12 ENCOUNTER — Other Ambulatory Visit: Payer: Self-pay

## 2021-09-17 ENCOUNTER — Other Ambulatory Visit: Payer: Self-pay

## 2021-10-15 ENCOUNTER — Other Ambulatory Visit: Payer: Self-pay

## 2021-10-16 ENCOUNTER — Other Ambulatory Visit: Payer: Self-pay

## 2021-10-17 MED ORDER — LISINOPRIL 5 MG PO TABS
ORAL_TABLET | ORAL | 3 refills | Status: DC
  Filled 2021-10-17: qty 90, 90d supply, fill #0
  Filled 2022-02-13: qty 90, 90d supply, fill #1
  Filled 2022-05-20: qty 90, 90d supply, fill #2
  Filled 2022-09-17: qty 90, 90d supply, fill #3

## 2021-10-17 MED ORDER — PIOGLITAZONE HCL 45 MG PO TABS
ORAL_TABLET | ORAL | 3 refills | Status: DC
  Filled 2021-10-17: qty 90, 90d supply, fill #0
  Filled 2022-02-13: qty 90, 90d supply, fill #1
  Filled 2022-05-20: qty 90, 90d supply, fill #2
  Filled 2022-09-17: qty 90, 90d supply, fill #3

## 2021-10-18 ENCOUNTER — Other Ambulatory Visit: Payer: Self-pay

## 2021-12-13 ENCOUNTER — Other Ambulatory Visit: Payer: Self-pay

## 2021-12-13 ENCOUNTER — Other Ambulatory Visit: Payer: Self-pay | Admitting: Cardiovascular Disease

## 2021-12-13 MED FILL — Carvedilol Tab 3.125 MG: ORAL | 90 days supply | Qty: 180 | Fill #0 | Status: AC

## 2021-12-16 ENCOUNTER — Other Ambulatory Visit: Payer: Self-pay

## 2022-01-01 ENCOUNTER — Other Ambulatory Visit: Payer: Self-pay

## 2022-01-01 DIAGNOSIS — E785 Hyperlipidemia, unspecified: Secondary | ICD-10-CM | POA: Diagnosis not present

## 2022-01-01 DIAGNOSIS — Z125 Encounter for screening for malignant neoplasm of prostate: Secondary | ICD-10-CM | POA: Diagnosis not present

## 2022-01-01 DIAGNOSIS — E1165 Type 2 diabetes mellitus with hyperglycemia: Secondary | ICD-10-CM | POA: Diagnosis not present

## 2022-01-01 DIAGNOSIS — I10 Essential (primary) hypertension: Secondary | ICD-10-CM | POA: Diagnosis not present

## 2022-01-01 DIAGNOSIS — I25118 Atherosclerotic heart disease of native coronary artery with other forms of angina pectoris: Secondary | ICD-10-CM | POA: Diagnosis not present

## 2022-01-01 DIAGNOSIS — E113299 Type 2 diabetes mellitus with mild nonproliferative diabetic retinopathy without macular edema, unspecified eye: Secondary | ICD-10-CM | POA: Diagnosis not present

## 2022-01-01 DIAGNOSIS — E1159 Type 2 diabetes mellitus with other circulatory complications: Secondary | ICD-10-CM | POA: Diagnosis not present

## 2022-01-01 DIAGNOSIS — N529 Male erectile dysfunction, unspecified: Secondary | ICD-10-CM | POA: Diagnosis not present

## 2022-01-01 MED ORDER — METFORMIN HCL ER 500 MG PO TB24
ORAL_TABLET | ORAL | 3 refills | Status: AC
  Filled 2022-01-01: qty 180, 90d supply, fill #0
  Filled 2022-11-06: qty 180, 90d supply, fill #1

## 2022-01-01 MED ORDER — INVOKANA 300 MG PO TABS
ORAL_TABLET | ORAL | 3 refills | Status: DC
  Filled 2022-01-01: qty 90, 90d supply, fill #0

## 2022-01-01 MED ORDER — JARDIANCE 25 MG PO TABS
25.0000 mg | ORAL_TABLET | Freq: Every day | ORAL | 3 refills | Status: DC
  Filled 2022-01-01: qty 90, 90d supply, fill #0
  Filled 2022-04-04: qty 90, 90d supply, fill #1
  Filled 2022-07-01: qty 90, 90d supply, fill #2
  Filled 2022-09-17: qty 90, 90d supply, fill #3

## 2022-01-01 MED ORDER — CLOPIDOGREL BISULFATE 75 MG PO TABS
ORAL_TABLET | ORAL | 3 refills | Status: DC
  Filled 2022-01-01: qty 90, 90d supply, fill #0
  Filled 2022-04-22: qty 90, 90d supply, fill #1
  Filled 2022-07-19: qty 90, 90d supply, fill #2
  Filled 2022-10-21: qty 90, 90d supply, fill #3

## 2022-01-01 MED ORDER — ATORVASTATIN CALCIUM 80 MG PO TABS
ORAL_TABLET | ORAL | 3 refills | Status: DC
  Filled 2022-01-01: qty 90, 90d supply, fill #0
  Filled 2022-08-20: qty 90, 90d supply, fill #1
  Filled 2022-12-05: qty 90, 90d supply, fill #2

## 2022-01-01 MED ORDER — CARVEDILOL 3.125 MG PO TABS
ORAL_TABLET | ORAL | 3 refills | Status: DC
  Filled 2022-01-01 – 2022-03-22 (×2): qty 180, 90d supply, fill #0
  Filled 2022-07-01: qty 180, 90d supply, fill #1
  Filled 2022-09-17: qty 180, 90d supply, fill #2

## 2022-01-04 ENCOUNTER — Other Ambulatory Visit: Payer: Self-pay

## 2022-02-02 ENCOUNTER — Ambulatory Visit
Admission: EM | Admit: 2022-02-02 | Discharge: 2022-02-02 | Disposition: A | Discharge status: Home / Self Care | Payer: 59 | Attending: Family Medicine | Admitting: Family Medicine

## 2022-02-02 ENCOUNTER — Ambulatory Visit (INDEPENDENT_AMBULATORY_CARE_PROVIDER_SITE_OTHER): Payer: 59

## 2022-02-02 ENCOUNTER — Encounter: Payer: Self-pay | Admitting: Emergency Medicine

## 2022-02-02 DIAGNOSIS — M79645 Pain in left finger(s): Secondary | ICD-10-CM

## 2022-02-02 DIAGNOSIS — M7989 Other specified soft tissue disorders: Secondary | ICD-10-CM | POA: Diagnosis not present

## 2022-02-02 DIAGNOSIS — S60112A Contusion of left thumb with damage to nail, initial encounter: Secondary | ICD-10-CM

## 2022-02-02 DIAGNOSIS — S60012A Contusion of left thumb without damage to nail, initial encounter: Secondary | ICD-10-CM | POA: Diagnosis not present

## 2022-02-02 NOTE — Discharge Instructions (Addendum)
Es posible que la sangre siga saliendo del agujero de la ua del dedo. Me alegro que el dolor se est aliviando. No tena ninguna fractura en las radiografas. Puede tomar Tylenol o ibuprofeno segn sea necesario para el dolor. Ver folleto sobre hematomas subungueales  Blood may continue to ooze out of the hole in the nail of your finger.  I am glad that the pain is being relieved.  You did not have a fracture on your x-rays.  You can take Tylenol or ibuprofen as needed for pain.  See handout on subungual hematomas

## 2022-02-02 NOTE — ED Provider Notes (Signed)
[facility]    CSN: [id number] Arrival date & time: 02/02/22  5456      History   Chief Complaint Chief Complaint  Patient presents with   thumb injury    HPI  HPI Jeff Howard is a 61 y.o. male.   Armandop presents with his wife for left thumb injury that happened on Thursday.  Reports immediate pain.  He says it has been very painful.  The pain is moderate to severe and is described as throbbing.  The tip of his finger is very swollen and discolored.  The nail has turned very dark.  He took Tylenol little relief.  He works in Nurse, children's.  He is right-handed.  No other injuries.  Denies fever, nausea, vomiting, shoulder, elbow or elbow pain.    Fever : no  Sore throat: no   Cough: no Appetite: normal  Hydration: normal  Abdominal pain: no Nausea: no Vomiting: no Back Pain: no Headache: no     Past Medical History:  Diagnosis Date   Anginal pain (HCC)    CAD (coronary artery disease)    a. 02/2015 NSTEMI/PCI: LM nl, LAD 94m (2.75x23 Xience/2.5x8 Xience DES), D2 20ost, LCX 76m, RCA 23m, RPDA1/2 40, EF 45-50.   Diabetes mellitus without complication (HCC)    Dizziness    Essential hypertension    Hyperlipemia    Ischemic cardiomyopathy    a. 02/2015 Echo: Ef 45-50%, mild apicalanterior HK, sev apical HK, triv MR.   Myocardial infarction [facility])     Patient Active Problem List   Diagnosis Date Noted   Acute loss of vision, right 11/04/2019   Sudden loss of vision, right 11/04/2019   Bradycardia 11/04/2019   CAD (coronary artery disease)    Hyperlipemia    Hypertension    Type 2 diabetes mellitus without complication (HCC)    Ischemic cardiomyopathy    Pain in the chest    Angina pectoris (HCC)    Poorly controlled type 2 diabetes mellitus with circulatory disorder (HCC)    Hyperlipidemia    NSTEMI (non-ST elevated myocardial infarction) (HCC) 03/05/2015    Past Surgical History:  Procedure Laterality Date   arm surgery     left    CARDIAC CATHETERIZATION N/A 03/06/2015   Procedure: Left Heart Cath and Coronary Angiography;  Surgeon: Iran Ouch, MD;  Location: [facility] INVASIVE CV LAB;  Service: Cardiovascular;  Laterality: N/A;   CARDIAC CATHETERIZATION N/A 03/06/2015   Procedure: Coronary Stent Intervention;  Surgeon: Iran Ouch, MD;  Location: [facility] INVASIVE CV LAB;  Service: Cardiovascular;  Laterality: N/A;   COLONOSCOPY     COLONOSCOPY N/A 03/27/2020   Procedure: COLONOSCOPY;  Surgeon: Regis Bill, MD;  Location: [facility] ENDOSCOPY;  Service: Endoscopy;  Laterality: N/A;   CORONARY ANGIOPLASTY     CORONARY ANGIOPLASTY WITH STENT PLACEMENT     x2       Home Medications    Prior to Admission medications   Medication Sig Start Date End Date Taking? Authorizing Provider  atorvastatin (LIPITOR) 80 MG tablet take one tablet by mouth daily 10/06/20  Yes   atorvastatin (LIPITOR) 80 MG tablet Take 1 tablet (80 mg total) by mouth once daily 01/01/22  Yes   carvedilol (COREG) 3.125 MG tablet Take 1 tablet (3.125 mg total) by mouth 2 (two) times daily with meals 01/01/22  Yes   clopidogrel (PLAVIX) 75 MG tablet Take one tablet by mouth once daily 07/20/21  Yes Iran Ouch, MD  clopidogrel (PLAVIX) 75 MG tablet Take 1 tablet (75 mg total) by mouth once daily 01/01/22  Yes   empagliflozin (JARDIANCE) 10 MG TABS tablet Take 1 tablet (10 mg total) by mouth. 06/15/21  Yes   empagliflozin (JARDIANCE) 25 MG TABS tablet Take 1 tablet (25 mg total) by mouth once daily 01/01/22  Yes   EPINEPHrine 0.3 mg/0.3 mL IJ SOAJ injection inject 0.3 ml into the muscle once as needed for anaphylaxis 10/06/20  Yes   lisinopril (ZESTRIL) 5 MG tablet take one tablet by mouth daily 10/17/21  Yes   metFORMIN (GLUCOPHAGE-XR) 500 MG 24 hr tablet take 4 tablets by mouth daily with dinner 10/06/20  Yes   metFORMIN (GLUCOPHAGE-XR) 500 MG 24 hr tablet Take 2 tablets (1,000 mg total) by mouth daily with dinner 01/01/22  Yes   nitroGLYCERIN  (NITROSTAT) 0.4 MG SL tablet Place one tablet under the tongue every 5 minutes as needed for chest pain 10/06/20  Yes   pioglitazone (ACTOS) 45 MG tablet take one tablet by mouth daily 10/17/21  Yes     Family History Family History  Problem Relation Age of Onset   Heart attack Father    Diabetes Other     Social History Social History   Tobacco Use   Smoking status: Never   Smokeless tobacco: Never  Vaping Use   Vaping Use: Never used  Substance Use Topics   Alcohol use: No   Drug use: No     Allergies   Bee venom and Effient [prasugrel]   Review of Systems Review of Systems: :negative unless otherwise stated in HPI.      Physical Exam Triage Vital Signs ED Triage Vitals  Enc Vitals Group     BP 02/02/22 0841 (!) 142/85     Pulse Rate 02/02/22 0841 (!) 52     Resp 02/02/22 0841 15     Temp 02/02/22 0841 98.2 F (36.8 C)     Temp Source 02/02/22 0841 Oral     SpO2 02/02/22 0841 100 %     Weight --      Height --      Head Circumference --      Peak Flow --      Pain Score 02/02/22 0839 6     Pain Loc --      Pain Edu? --      Excl. in GC? --    No data found.  Updated Vital Signs BP (!) 142/85 (BP Location: Left Arm)   Pulse (!) 52   Temp 98.2 F (36.8 C) (Oral)   Resp 15   SpO2 100%   Visual Acuity Right Eye Distance:   Left Eye Distance:   Bilateral Distance:    Right Eye Near:   Left Eye Near:    Bilateral Near:     Physical Exam GEN: well appearing male in no acute distress  CVS: well perfused,  RESP: speaking in full sentences without pause, no respiratory distress  MSK: Left thumb: Purple nail and distal discoloration of left thumb No evidence of bony deformity, asymmetry, or muscle atrophy. Tenderness over PIP joint extending distally, no CMC joint tenderness, no tenderness or deformities to digits 2 through 5. Range of motion limited by pain at PIP joint, otherwise normal thumb range of motion  Strength 5/5 grip, elbow and  shoulder. Sensation intact. Peripheral pulses intact.   UC Treatments / Results  Labs (all labs ordered are listed, but only abnormal results are displayed) Labs  Reviewed - No data to display  EKG   Radiology DG Finger Thumb Left  Result Date: 02/02/2022 CLINICAL DATA:  61 year old male with pain, swelling and bruising of the thumb after striking thumb with a hammer 3 days ago. EXAM: LEFT THUMB 2+V COMPARISON:  No priors. FINDINGS: Three views of the left thumb demonstrate mild diffuse soft tissue swelling. No acute displaced fracture, subluxation or dislocation. IMPRESSION: 1. No acute osseous abnormality of the left thumb. Electronically Signed   By: Trudie Reed M.D.   On: 02/02/2022 09:24    Procedures Nail Removal  Date/Time: 02/02/2022 11:36 AM  Performed by: Katha Cabal, DO Authorized by: Katha Cabal, DO   Consent:    Consent obtained:  Verbal   Consent given by:  Patient   Risks, benefits, and alternatives were discussed: yes     Risks discussed:  Bleeding, pain and incomplete removal   Alternatives discussed:  No treatment and referral Location:    Hand:  L thumb Pre-procedure details:    Skin preparation:  Alcohol Anesthesia:    Anesthesia method:  None Trephination:    Subungual hematoma drained: yes     Trephination instrument:  Needle Post-procedure details:    Procedure completion:  Tolerated  (including critical care time)  Medications Ordered in UC Medications - No data to display  Initial Impression / Assessment and Plan / UC Course  I have reviewed the triage vital signs and the nursing notes.  Pertinent labs & imaging results that were available during my care of the patient were reviewed by me and considered in my medical decision making (see chart for details).      Pt is a 61 y.o.  male presents for days of 3 days of left thumb pain after traumatic injury with a hammer.  On exam, he has left thumbnail discoloration and pain to  light palpation.  Has decreased range of motion of the PIP joint.  I am concerned that he may have fractured this due to the discoloration and pain.  Obtained left thumb plain films.  Personally reviewed by me were unremarkable for fracture or dislocation.  Offered pain control here however patient declined.  Recommended trephination of the nail and he is agreeable.  Patient with movement of pain status postprocedure.  Over-the-counter analgesics, as needed.   Discussed MDM, treatment plan and plan for follow-up with patient/parent who agrees with plan.   Final Clinical Impressions(s) / UC Diagnoses   Final diagnoses:  Subungual hematoma of left thumb, initial encounter     Discharge Instructions      Es posible que la sangre siga saliendo del agujero de la ua del dedo. Me alegro que el dolor se est aliviando. No tena ninguna fractura en las radiografas. Puede tomar Tylenol o ibuprofeno segn sea necesario para el dolor. Ver folleto sobre hematomas subungueales  Blood may continue to ooze out of the hole in the nail of your finger.  I am glad that the pain is being relieved.  You did not have a fracture on your x-rays.  You can take Tylenol or ibuprofen as needed for pain.  See handout on subungual hematomas     ED Prescriptions   None    PDMP not reviewed this encounter.   Katha Cabal, DO 02/02/22 1153

## 2022-02-02 NOTE — ED Triage Notes (Signed)
Pt c/o hitting left thumb with hammer x 3 days ago. Finger nail is discolored and painful. Has been taking tylenol with mild relief.

## 2022-02-13 ENCOUNTER — Other Ambulatory Visit: Payer: Self-pay

## 2022-03-22 ENCOUNTER — Other Ambulatory Visit: Payer: Self-pay

## 2022-03-31 ENCOUNTER — Other Ambulatory Visit: Payer: Self-pay

## 2022-04-02 ENCOUNTER — Other Ambulatory Visit: Payer: Self-pay

## 2022-04-04 ENCOUNTER — Other Ambulatory Visit: Payer: Self-pay

## 2022-04-22 ENCOUNTER — Other Ambulatory Visit: Payer: Self-pay

## 2022-04-26 DIAGNOSIS — M1711 Unilateral primary osteoarthritis, right knee: Secondary | ICD-10-CM | POA: Diagnosis not present

## 2022-05-20 ENCOUNTER — Other Ambulatory Visit: Payer: Self-pay

## 2022-07-01 ENCOUNTER — Other Ambulatory Visit: Payer: Self-pay

## 2022-07-04 ENCOUNTER — Encounter: Payer: Self-pay | Admitting: Cardiovascular Disease

## 2022-07-04 ENCOUNTER — Ambulatory Visit: Payer: Commercial Managed Care - PPO | Attending: Cardiovascular Disease | Admitting: Cardiovascular Disease

## 2022-07-04 VITALS — BP 122/86 | HR 49 | Ht 66.0 in | Wt 170.0 lb

## 2022-07-04 DIAGNOSIS — I255 Ischemic cardiomyopathy: Secondary | ICD-10-CM

## 2022-07-04 DIAGNOSIS — E785 Hyperlipidemia, unspecified: Secondary | ICD-10-CM

## 2022-07-04 DIAGNOSIS — G459 Transient cerebral ischemic attack, unspecified: Secondary | ICD-10-CM

## 2022-07-04 DIAGNOSIS — I2510 Atherosclerotic heart disease of native coronary artery without angina pectoris: Secondary | ICD-10-CM | POA: Diagnosis not present

## 2022-07-04 NOTE — Progress Notes (Signed)
Cardiology Office Note   Date:  07/04/2022   ID:  Jeff Howard, DOB 1960-09-06, MRN [id number]  PCP:  Jeff Lou, MD  Cardiologist:   Jeff Bears, MD   Chief Complaint  Patient presents with   12 month follow up     "Doing well." Medications reviewed by the patient verbally.       History of Present Illness: Jeff Howard is a 62 y.o. male who presents for a follow-up visit regarding coronary artery disease . He presented in September 2016 with non-ST elevation myocardial infarction with borderline anterior ST elevation. Cardiac catheterization revealed 99% mid LAD stenosis and 60% mid left circumflex stenosis. Ejection fraction was 45-50% with distal anterior wall and apical hypokinesis. He underwent successful angioplasty and 2 overlapped drug-eluting stent placement to the mid LAD.   He had atypical chest pain in 2018.  He underwent a treadmill stress test which showed no evidence of ischemia.  He was able to exercise for 11 minutes and 22 seconds. He has chronic medical conditions including type 2 diabetes, essential hypertension and hyperlipidemia.  He was hospitalized in May of 2021  with amaurosis fugax affecting the right eye. MRI of the brain showed no acute changes. Echocardiogram showed normal LV systolic function and negative bubble study. Outpatient ZIO monitor showed no evidence of atrial fibrillation. CTA of the neck showed mild nonobstructive carotid disease and high-grade stenosis at the ostial left vertebral artery.  No recurrent symptoms since then.   He has been doing very well with no recent chest pain, shortness of breath or palpitations.  He is mildly bradycardic but denies dizziness.  Past Medical History:  Diagnosis Date   Anginal pain (HCC)    CAD (coronary artery disease)    a. 02/2015 NSTEMI/PCI: LM nl, LAD 61m (2.75x23 Xience/2.5x8 Xience DES), D2 20ost, LCX 24m, RCA 36m, RPDA1/2 40, EF 45-50.   Diabetes mellitus without  complication (HCC)    Dizziness    Essential hypertension    Hyperlipemia    Ischemic cardiomyopathy    a. 02/2015 Echo: Ef 45-50%, mild apicalanterior HK, sev apical HK, triv MR.   Myocardial infarction Jeff Howard)     Past Surgical History:  Procedure Laterality Date   arm surgery     left   CARDIAC CATHETERIZATION N/A 03/06/2015   Procedure: Left Heart Cath and Coronary Angiography;  Surgeon: Iran Ouch, MD;  Location: [facility] INVASIVE CV LAB;  Service: Cardiovascular;  Laterality: N/A;   CARDIAC CATHETERIZATION N/A 03/06/2015   Procedure: Coronary Stent Intervention;  Surgeon: Iran Ouch, MD;  Location: [facility] CV LAB;  Service: Cardiovascular;  Laterality: N/A;   COLONOSCOPY     COLONOSCOPY N/A 03/27/2020   Procedure: COLONOSCOPY;  Surgeon: Regis Bill, MD;  Location: [facility] ENDOSCOPY;  Service: Endoscopy;  Laterality: N/A;   CORONARY ANGIOPLASTY     CORONARY ANGIOPLASTY WITH STENT PLACEMENT     x2     Current Outpatient Medications  Medication Sig Dispense Refill   atorvastatin (LIPITOR) 80 MG tablet Take 1 tablet (80 mg total) by mouth once daily 90 tablet 3   carvedilol (COREG) 3.125 MG tablet Take 1 tablet (3.125 mg total) by mouth 2 (two) times daily with meals 180 tablet 3   clopidogrel (PLAVIX) 75 MG tablet Take 1 tablet (75 mg total) by mouth once daily 90 tablet 3   empagliflozin (JARDIANCE) 25 MG TABS tablet Take 1 tablet (25 mg total) by mouth once daily 90 tablet 3  EPINEPHrine 0.3 mg/0.3 mL IJ SOAJ injection inject 0.3 ml into the muscle once as needed for anaphylaxis 2 each 1   lisinopril (ZESTRIL) 5 MG tablet take one tablet by mouth daily 90 tablet 3   metFORMIN (GLUCOPHAGE-XR) 500 MG 24 hr tablet Take 2 tablets (1,000 mg total) by mouth daily with dinner 180 tablet 3   nitroGLYCERIN (NITROSTAT) 0.4 MG SL tablet Place one tablet under the tongue every 5 minutes as needed for chest pain 25 tablet 2   pioglitazone (ACTOS) 45 MG tablet take one  tablet by mouth daily 90 tablet 3   sildenafil (VIAGRA) 100 MG tablet Take 100 mg by mouth daily as needed.     empagliflozin (JARDIANCE) 10 MG TABS tablet Take 1 tablet (10 mg total) by mouth. (Patient not taking: Reported on 07/04/2022) 30 tablet 11   metFORMIN (GLUCOPHAGE-XR) 500 MG 24 hr tablet take 4 tablets by mouth daily with dinner (Patient not taking: Reported on 07/04/2022) 360 tablet 3   No current facility-administered medications for this visit.    Allergies:   Bee venom and Effient [prasugrel]    Social History:  The patient  reports that he has never smoked. He has never used smokeless tobacco. He reports that he does not drink alcohol and does not use drugs.   Family History:  The patient's family history includes Diabetes in an other family member; Heart attack in his father.    ROS:  Please see the history of present illness.   Otherwise, review of systems are positive for none.   All other systems are reviewed and negative.    PHYSICAL EXAM: VS:  BP 122/86 (BP Location: Left Arm, Patient Position: Sitting, Cuff Size: Normal)   Pulse (!) 49   Ht 5\' 6"  (1.676 m)   Wt 170 lb (77.1 kg)   SpO2 98%   BMI 27.44 kg/m  , BMI Body mass index is 27.44 kg/m. GEN: Well nourished, well developed, in no acute distress  HEENT: normal  Neck: no JVD, carotid bruits, or masses Cardiac: RRR; no murmurs, rubs, or gallops,no edema  Respiratory:  clear to auscultation bilaterally, normal work of breathing GI: soft, nontender, nondistended, + BS MS: no deformity or atrophy  Skin: warm and dry, no rash Neuro:  Strength and sensation are intact Psych: euthymic mood, full affect   EKG:  EKG is ordered today. EKG showed sinus bradycardia with no significant ST or T wave changes.  Heart rate is 49 bpm    Recent Labs: No results found for requested labs within last 365 days.    Lipid Panel    Component Value Date/Time   CHOL 140 03/06/2015 0011   TRIG 60 03/06/2015 0011   HDL  52 03/06/2015 0011   CHOLHDL 2.7 03/06/2015 0011   VLDL 12 03/06/2015 0011   LDLCALC 76 03/06/2015 0011      Wt Readings from Last 3 Encounters:  07/04/22 170 lb (77.1 kg)  05/18/21 171 lb (77.6 kg)  05/18/20 178 lb 2 oz (80.8 kg)        ASSESSMENT AND PLAN:  1.  Coronary artery disease involving native coronary arteries without angina: He is doing extremely well.   He is on Plavix monotherapy given his coronary artery disease and previous TIA.  2. Hyperlipidemia: Continue high-dose atorvastatin 80 mg daily.  Most recent lipid profile in July showed an LDL of 64.  3. Ischemic cardiomyopathy: Ejection fraction was 45-50%. He has no symptoms suggestive of volume overload  or heart failure.  Continue small dose carvedilol and lisinopril.  He is mildly cardiac but is asymptomatic.  4.  TIA: No recurrent symptoms.  Continue clopidogrel long-term.  5.  Type 2 diabetes: Most recently globin A1c was 7.3. I discussed with him the importance of at least 150 minutes of exercise weekly.   Disposition:   FU with me in 12 months  Signed,  Kathlyn Sacramento, MD  07/04/2022 8:42 AM    [place]

## 2022-07-04 NOTE — Patient Instructions (Signed)
Medication Instructions:  No changes *If you need a refill on your cardiac medications before your next appointment, please call your pharmacy*   Lab Work: None ordered If you have labs (blood work) drawn today and your tests are completely normal, you will receive your results only by: MyChart Message (if you have MyChart) OR A paper copy in the mail If you have any lab test that is abnormal or we need to change your treatment, we will call you to review the results.   Testing/Procedures: None ordered   Follow-Up: At [facility], you and your health needs are our priority.  As part of our continuing mission to provide you with exceptional heart care, we have created designated Provider Care Teams.  These Care Teams include your primary Cardiologist (physician) and Advanced Practice Providers (APPs -  Physician Assistants and Nurse Practitioners) who all work together to provide you with the care you need, when you need it.  We recommend signing up for the patient portal called "MyChart".  Sign up information is provided on this After Visit Summary.  MyChart is used to connect with patients for Virtual Visits (Telemedicine).  Patients are able to view lab/test results, encounter notes, upcoming appointments, etc.  Non-urgent messages can be sent to your provider as well.   To learn more about what you can do with MyChart, go to [email or web address]://[email or web address].com.    Your next appointment:   12 month(s)  Provider:   You may see Muhammad Arida, MD or one of the following Advanced Practice Providers on your designated Care Team:   Christopher Berge, NP Ryan Dunn, PA-C Cadence Furth, PA-C Sheri Hammock, NP    

## 2022-07-10 ENCOUNTER — Other Ambulatory Visit: Payer: Self-pay

## 2022-07-10 DIAGNOSIS — Z0000 Encounter for general adult medical examination without abnormal findings: Secondary | ICD-10-CM | POA: Diagnosis not present

## 2022-07-10 DIAGNOSIS — Z1331 Encounter for screening for depression: Secondary | ICD-10-CM | POA: Diagnosis not present

## 2022-07-10 DIAGNOSIS — N529 Male erectile dysfunction, unspecified: Secondary | ICD-10-CM | POA: Diagnosis not present

## 2022-07-10 DIAGNOSIS — I10 Essential (primary) hypertension: Secondary | ICD-10-CM | POA: Diagnosis not present

## 2022-07-10 DIAGNOSIS — E785 Hyperlipidemia, unspecified: Secondary | ICD-10-CM | POA: Diagnosis not present

## 2022-07-10 DIAGNOSIS — I25118 Atherosclerotic heart disease of native coronary artery with other forms of angina pectoris: Secondary | ICD-10-CM | POA: Diagnosis not present

## 2022-07-10 DIAGNOSIS — E119 Type 2 diabetes mellitus without complications: Secondary | ICD-10-CM | POA: Diagnosis not present

## 2022-07-10 DIAGNOSIS — Z1330 Encounter for screening examination for mental health and behavioral disorders, unspecified: Secondary | ICD-10-CM | POA: Diagnosis not present

## 2022-07-10 MED ORDER — SILDENAFIL CITRATE 100 MG PO TABS
100.0000 mg | ORAL_TABLET | Freq: Every day | ORAL | 2 refills | Status: AC | PRN
  Filled 2022-07-10: qty 6, 30d supply, fill #0
  Filled 2022-09-17: qty 6, 30d supply, fill #1
  Filled 2022-12-05: qty 6, 30d supply, fill #2

## 2022-07-19 ENCOUNTER — Other Ambulatory Visit: Payer: Self-pay

## 2022-08-20 ENCOUNTER — Other Ambulatory Visit: Payer: Self-pay

## 2022-09-17 ENCOUNTER — Other Ambulatory Visit: Payer: Self-pay

## 2022-10-21 ENCOUNTER — Other Ambulatory Visit: Payer: Self-pay

## 2022-11-06 ENCOUNTER — Other Ambulatory Visit: Payer: Self-pay

## 2022-12-05 ENCOUNTER — Other Ambulatory Visit: Payer: Self-pay

## 2022-12-18 DIAGNOSIS — H35033 Hypertensive retinopathy, bilateral: Secondary | ICD-10-CM | POA: Diagnosis not present

## 2022-12-18 DIAGNOSIS — H524 Presbyopia: Secondary | ICD-10-CM | POA: Diagnosis not present

## 2023-01-01 ENCOUNTER — Other Ambulatory Visit: Payer: Self-pay

## 2023-01-01 MED ORDER — EMPAGLIFLOZIN 25 MG PO TABS
25.0000 mg | ORAL_TABLET | Freq: Every day | ORAL | 3 refills | Status: AC
  Filled 2023-01-01: qty 90, 90d supply, fill #0
  Filled 2023-03-31: qty 90, 90d supply, fill #1
  Filled 2023-06-30: qty 90, 90d supply, fill #2

## 2023-01-15 ENCOUNTER — Other Ambulatory Visit: Payer: Self-pay

## 2023-01-15 ENCOUNTER — Other Ambulatory Visit: Payer: Self-pay | Admitting: Cardiovascular Disease

## 2023-01-15 NOTE — Telephone Encounter (Signed)
Hi,   Please advise if ok to refill prescription under Dr. Kirke Corin or have defer to PCP? The RX was last written in 12/2021 for a year by Damaris Schooner, DO from [facility] in [place]. Thank you so much.

## 2023-01-16 ENCOUNTER — Other Ambulatory Visit: Payer: Self-pay

## 2023-01-16 MED ORDER — CARVEDILOL 3.125 MG PO TABS
3.1250 mg | ORAL_TABLET | Freq: Two times a day (BID) | ORAL | 3 refills | Status: DC
  Filled 2023-01-16: qty 180, 90d supply, fill #0
  Filled 2023-03-31: qty 180, 90d supply, fill #1
  Filled 2023-07-23: qty 180, 90d supply, fill #2
  Filled 2023-11-12: qty 180, 90d supply, fill #3

## 2023-01-16 MED FILL — Clopidogrel Bisulfate Tab 75 MG (Base Equiv): ORAL | 90 days supply | Qty: 90 | Fill #0 | Status: AC

## 2023-01-16 NOTE — Telephone Encounter (Signed)
Dr. Kirke Corin has been prescribing clopidogrel since 2017. Since pt's medication to pt's pharmacy as requested. Confirmation received.

## 2023-01-17 ENCOUNTER — Other Ambulatory Visit: Payer: Self-pay

## 2023-01-17 MED ORDER — LISINOPRIL 5 MG PO TABS
5.0000 mg | ORAL_TABLET | Freq: Every day | ORAL | 3 refills | Status: DC
  Filled 2023-01-17: qty 90, 90d supply, fill #0
  Filled 2023-03-31: qty 90, 90d supply, fill #1
  Filled 2023-07-29: qty 60, 60d supply, fill #2
  Filled 2023-07-29: qty 30, 30d supply, fill #2

## 2023-01-17 MED ORDER — PIOGLITAZONE HCL 45 MG PO TABS
45.0000 mg | ORAL_TABLET | Freq: Every day | ORAL | 3 refills | Status: AC
  Filled 2023-01-17: qty 90, 90d supply, fill #0
  Filled 2023-03-31 – 2023-04-01 (×2): qty 90, 90d supply, fill #1
  Filled 2023-07-29: qty 90, 90d supply, fill #2

## 2023-02-14 ENCOUNTER — Other Ambulatory Visit: Payer: Self-pay

## 2023-02-14 DIAGNOSIS — I25118 Atherosclerotic heart disease of native coronary artery with other forms of angina pectoris: Secondary | ICD-10-CM | POA: Diagnosis not present

## 2023-02-14 DIAGNOSIS — N529 Male erectile dysfunction, unspecified: Secondary | ICD-10-CM | POA: Diagnosis not present

## 2023-02-14 DIAGNOSIS — E785 Hyperlipidemia, unspecified: Secondary | ICD-10-CM | POA: Diagnosis not present

## 2023-02-14 DIAGNOSIS — I10 Essential (primary) hypertension: Secondary | ICD-10-CM | POA: Diagnosis not present

## 2023-02-14 DIAGNOSIS — I252 Old myocardial infarction: Secondary | ICD-10-CM | POA: Diagnosis not present

## 2023-02-14 DIAGNOSIS — E119 Type 2 diabetes mellitus without complications: Secondary | ICD-10-CM | POA: Diagnosis not present

## 2023-02-14 DIAGNOSIS — Z125 Encounter for screening for malignant neoplasm of prostate: Secondary | ICD-10-CM | POA: Diagnosis not present

## 2023-02-14 DIAGNOSIS — Z23 Encounter for immunization: Secondary | ICD-10-CM | POA: Diagnosis not present

## 2023-02-14 MED ORDER — ATORVASTATIN CALCIUM 80 MG PO TABS
80.0000 mg | ORAL_TABLET | Freq: Every day | ORAL | 3 refills | Status: AC
  Filled 2023-02-14: qty 90, 90d supply, fill #0
  Filled 2023-07-23: qty 90, 90d supply, fill #1
  Filled 2023-11-12: qty 90, 90d supply, fill #2

## 2023-02-14 MED ORDER — METFORMIN HCL ER 500 MG PO TB24
1000.0000 mg | ORAL_TABLET | Freq: Every day | ORAL | 3 refills | Status: AC
  Filled 2023-02-14: qty 180, 90d supply, fill #0
  Filled 2023-04-15 – 2023-07-29 (×4): qty 180, 90d supply, fill #1

## 2023-02-14 MED ORDER — SILDENAFIL CITRATE 100 MG PO TABS
100.0000 mg | ORAL_TABLET | Freq: Every day | ORAL | 2 refills | Status: AC | PRN
  Filled 2023-02-14: qty 6, 30d supply, fill #0
  Filled 2023-04-15: qty 6, 30d supply, fill #1

## 2023-02-20 ENCOUNTER — Other Ambulatory Visit: Payer: Self-pay

## 2023-03-31 ENCOUNTER — Other Ambulatory Visit: Payer: Self-pay

## 2023-03-31 MED FILL — Clopidogrel Bisulfate Tab 75 MG (Base Equiv): ORAL | 90 days supply | Qty: 90 | Fill #1 | Status: AC

## 2023-04-01 ENCOUNTER — Other Ambulatory Visit: Payer: Self-pay

## 2023-04-02 ENCOUNTER — Other Ambulatory Visit: Payer: Self-pay

## 2023-04-03 ENCOUNTER — Other Ambulatory Visit: Payer: Self-pay

## 2023-04-15 ENCOUNTER — Other Ambulatory Visit: Payer: Self-pay

## 2023-04-17 ENCOUNTER — Other Ambulatory Visit: Payer: Self-pay

## 2023-04-28 ENCOUNTER — Other Ambulatory Visit: Payer: Self-pay

## 2023-04-30 ENCOUNTER — Other Ambulatory Visit: Payer: Self-pay

## 2023-05-01 ENCOUNTER — Other Ambulatory Visit: Payer: Self-pay

## 2023-05-02 ENCOUNTER — Other Ambulatory Visit: Payer: Self-pay

## 2023-05-05 DIAGNOSIS — J069 Acute upper respiratory infection, unspecified: Secondary | ICD-10-CM | POA: Diagnosis not present

## 2023-05-05 DIAGNOSIS — R051 Acute cough: Secondary | ICD-10-CM | POA: Diagnosis not present

## 2023-05-12 ENCOUNTER — Other Ambulatory Visit: Payer: Self-pay

## 2023-06-30 ENCOUNTER — Other Ambulatory Visit: Payer: Self-pay

## 2023-07-09 ENCOUNTER — Other Ambulatory Visit: Payer: Self-pay | Admitting: Cardiovascular Disease

## 2023-07-09 ENCOUNTER — Other Ambulatory Visit: Payer: Self-pay

## 2023-07-09 MED ORDER — CLOPIDOGREL BISULFATE 75 MG PO TABS
75.0000 mg | ORAL_TABLET | Freq: Every day | ORAL | 0 refills | Status: DC
  Filled 2023-07-09: qty 90, 90d supply, fill #0

## 2023-07-09 NOTE — Telephone Encounter (Signed)
Last office visit: 07/04/22 with plan to f/u 12 months. next office visit: 10/09/23

## 2023-07-23 ENCOUNTER — Other Ambulatory Visit: Payer: Self-pay

## 2023-07-29 ENCOUNTER — Other Ambulatory Visit: Payer: Self-pay

## 2023-09-11 ENCOUNTER — Other Ambulatory Visit: Payer: Self-pay

## 2023-09-11 MED ORDER — SILDENAFIL CITRATE 100 MG PO TABS
100.0000 mg | ORAL_TABLET | Freq: Every day | ORAL | 2 refills | Status: AC | PRN
  Filled 2023-09-11: qty 6, 30d supply, fill #0
  Filled 2023-11-12: qty 10, 10d supply, fill #1
  Filled 2024-02-13: qty 10, 10d supply, fill #2
  Filled 2024-06-12 (×2): qty 10, 10d supply, fill #3

## 2023-09-11 MED ORDER — LISINOPRIL 10 MG PO TABS
10.0000 mg | ORAL_TABLET | Freq: Every day | ORAL | 3 refills | Status: AC
  Filled 2023-09-11: qty 90, 90d supply, fill #0
  Filled 2024-03-24: qty 90, 90d supply, fill #1
  Filled 2024-06-12 (×2): qty 90, 90d supply, fill #2

## 2023-09-11 MED ORDER — METFORMIN HCL ER 500 MG PO TB24
1000.0000 mg | ORAL_TABLET | Freq: Two times a day (BID) | ORAL | 3 refills | Status: AC
  Filled 2023-09-11 – 2024-02-02 (×2): qty 360, 90d supply, fill #0
  Filled 2024-06-12 (×2): qty 360, 90d supply, fill #1

## 2023-09-11 MED ORDER — JARDIANCE 25 MG PO TABS
25.0000 mg | ORAL_TABLET | Freq: Every day | ORAL | 3 refills | Status: AC
  Filled 2023-09-11: qty 90, 90d supply, fill #0
  Filled 2023-12-31: qty 90, 90d supply, fill #1
  Filled 2024-03-24: qty 90, 90d supply, fill #2
  Filled 2024-06-12 (×2): qty 90, 90d supply, fill #3

## 2023-09-12 ENCOUNTER — Other Ambulatory Visit: Payer: Self-pay

## 2023-10-07 ENCOUNTER — Other Ambulatory Visit: Payer: Self-pay

## 2023-10-07 ENCOUNTER — Other Ambulatory Visit: Payer: Self-pay | Admitting: Cardiovascular Disease

## 2023-10-08 ENCOUNTER — Other Ambulatory Visit: Payer: Self-pay

## 2023-10-08 MED ORDER — CLOPIDOGREL BISULFATE 75 MG PO TABS
75.0000 mg | ORAL_TABLET | Freq: Every day | ORAL | 0 refills | Status: AC
  Filled 2023-10-08: qty 90, 90d supply, fill #0

## 2023-10-09 ENCOUNTER — Encounter: Payer: Self-pay | Admitting: Cardiovascular Disease

## 2023-10-09 ENCOUNTER — Ambulatory Visit: Payer: Commercial Managed Care - PPO | Attending: Cardiovascular Disease | Admitting: Cardiovascular Disease

## 2023-10-09 VITALS — BP 130/64 | HR 54 | Ht 66.0 in | Wt 174.8 lb

## 2023-10-09 DIAGNOSIS — I255 Ischemic cardiomyopathy: Secondary | ICD-10-CM

## 2023-10-09 DIAGNOSIS — I2510 Atherosclerotic heart disease of native coronary artery without angina pectoris: Secondary | ICD-10-CM | POA: Diagnosis not present

## 2023-10-09 DIAGNOSIS — E785 Hyperlipidemia, unspecified: Secondary | ICD-10-CM | POA: Diagnosis not present

## 2023-10-09 NOTE — Patient Instructions (Signed)

## 2023-10-09 NOTE — Progress Notes (Signed)
 Cardiology Office Note   Date:  10/09/2023   ID:  Jeff Howard, DOB May 18, 1961, MRN [id number]  PCP:  Timothy Ford, MD  Cardiologist:   Antionette Kirks, MD   No chief complaint on file.     History of Present Illness: Jeff Howard is a 63 y.o. male who presents for a follow-up visit regarding coronary artery disease . He presented in September 2016 with non-ST elevation myocardial infarction with borderline anterior ST elevation. Cardiac catheterization revealed 99% mid LAD stenosis and 60% mid left circumflex stenosis. Ejection fraction was 45-50% with distal anterior wall and apical hypokinesis. He underwent successful angioplasty and 2 overlapped drug-eluting stent placement to the mid LAD.   He had atypical chest pain in 2018.  He underwent a treadmill stress test which showed no evidence of ischemia.  He was able to exercise for 11 minutes and 22 seconds. He has chronic medical conditions including type 2 diabetes, essential hypertension and hyperlipidemia.  He was hospitalized in May of 2021  with amaurosis fugax affecting the right eye. MRI of the brain showed no acute changes. Echocardiogram showed normal LV systolic function and negative bubble study. Outpatient ZIO monitor showed no evidence of atrial fibrillation. CTA of the neck showed mild nonobstructive carotid disease and high-grade stenosis at the ostial left vertebral artery.  No recurrent symptoms since then.   He has been doing well with no chest pain, shortness of breath or palpitations.  He takes his medications on a regular basis.  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  He does not exercise on a regular basis.  Past Medical History:  Diagnosis Date   Anginal pain (HCC)    CAD (coronary artery disease)    a. 02/2015 NSTEMI/PCI: LM nl, LAD 75m (2.75x23 Xience/2.5x8 Xience DES), D2 20ost, LCX 46m, RCA 14m, RPDA1/2 40, EF 45-50.   Diabetes mellitus without complication (HCC)     Dizziness    Essential hypertension    Hyperlipemia    Ischemic cardiomyopathy    a. 02/2015 Echo: Ef 45-50%, mild apicalanterior HK, sev apical HK, triv MR.   Myocardial infarction [facility])     Past Surgical History:  Procedure Laterality Date   arm surgery     left   CARDIAC CATHETERIZATION N/A 03/06/2015   Procedure: Left Heart Cath and Coronary Angiography;  Surgeon: Wenona Hamilton, MD;  Location: [facility] INVASIVE CV LAB;  Service: Cardiovascular;  Laterality: N/A;   CARDIAC CATHETERIZATION N/A 03/06/2015   Procedure: Coronary Stent Intervention;  Surgeon: Wenona Hamilton, MD;  Location: [facility] INVASIVE CV LAB;  Service: Cardiovascular;  Laterality: N/A;   COLONOSCOPY     COLONOSCOPY N/A 03/27/2020   Procedure: COLONOSCOPY;  Surgeon: Shane Darling, MD;  Location: [facility] ENDOSCOPY;  Service: Endoscopy;  Laterality: N/A;   CORONARY ANGIOPLASTY     CORONARY ANGIOPLASTY WITH STENT PLACEMENT     x2     Current Outpatient Medications  Medication Sig Dispense Refill   atorvastatin  (LIPITOR ) 80 MG tablet Take 1 tablet (80 mg total) by mouth daily. 90 tablet 3   carvedilol  (COREG ) 3.125 MG tablet Take 1 tablet (3.125 mg total) by mouth 2 (two) times daily with a meal. 180 tablet 3   clopidogrel  (PLAVIX ) 75 MG tablet Take 1 tablet (75 mg total) by mouth daily. 90 tablet 0   empagliflozin  (JARDIANCE ) 10 MG TABS tablet Take 1 tablet (10 mg total) by mouth. (Patient taking differently: Take 25 mg by mouth daily.)  30 tablet 11   empagliflozin  (JARDIANCE ) 25 MG TABS tablet Take 1 tablet (25 mg total) by mouth once daily 90 tablet 3   empagliflozin  (JARDIANCE ) 25 MG TABS tablet Take 1 tablet (25 mg total) by mouth once daily 90 tablet 3   EPINEPHrine  0.3 mg/0.3 mL IJ SOAJ injection inject 0.3 ml into the muscle once as needed for anaphylaxis 2 each 1   lisinopril  (ZESTRIL ) 10 MG tablet Take 1 tablet (10 mg total) by mouth once daily 90 tablet 3   metFORMIN  (GLUCOPHAGE -XR) 500 MG 24 hr tablet  Take 2 tablets (1,000 mg total) by mouth daily with dinner 180 tablet 3   metFORMIN  (GLUCOPHAGE -XR) 500 MG 24 hr tablet Take 2 tablets (1,000 mg total) by mouth daily with supper. 180 tablet 3   metFORMIN  (GLUCOPHAGE -XR) 500 MG 24 hr tablet Take 2 tablets (1,000 mg total) by mouth 2 (two) times daily with meals 360 tablet 3   nitroGLYCERIN  (NITROSTAT ) 0.4 MG SL tablet Place one tablet under the tongue every 5 minutes as needed for chest pain 25 tablet 2   pioglitazone  (ACTOS ) 45 MG tablet Take 1 tablet (45 mg total) by mouth daily. 90 tablet 3   sildenafil  (VIAGRA ) 100 MG tablet Take 100 mg by mouth daily as needed.     sildenafil  (VIAGRA ) 100 MG tablet Take 1 tablet (100 mg total) by mouth daily as needed for erectile dysfunction. 30 tablet 2   sildenafil  (VIAGRA ) 100 MG tablet Take 1 tablet (100 mg total) by mouth daily as needed for erectile dysfunction 30 tablet 2   sildenafil  (VIAGRA ) 100 MG tablet Take 1 tablet (100 mg total) by mouth once daily as needed for Erectile Dysfunction for up to 90 days 30 tablet 2   No current facility-administered medications for this visit.    Allergies:   Bee venom and Effient  [prasugrel ]    Social History:  The patient  reports that he has never smoked. He has never used smokeless tobacco. He reports that he does not drink alcohol and does not use drugs.   Family History:  The patient's family history includes Diabetes in an other family member; Heart attack in his father.    ROS:  Please see the history of present illness.   Otherwise, review of systems are positive for none.   All other systems are reviewed and negative.    PHYSICAL EXAM: VS:  BP 130/64 (BP Location: Left Arm, Patient Position: Sitting, Cuff Size: Normal)   Pulse (!) 54   Ht 5\' 6"  (1.676 m)   Wt 174 lb 12.8 oz (79.3 kg)   SpO2 97%   BMI 28.21 kg/m  , BMI Body mass index is 28.21 kg/m. GEN: Well nourished, well developed, in no acute distress  HEENT: normal  Neck: no JVD,  carotid bruits, or masses Cardiac: RRR; no murmurs, rubs, or gallops,no edema  Respiratory:  clear to auscultation bilaterally, normal work of breathing GI: soft, nontender, nondistended, + BS MS: no deformity or atrophy  Skin: warm and dry, no rash Neuro:  Strength and sensation are intact Psych: euthymic mood, full affect   EKG:  EKG is ordered today. EKG showed : Sinus bradycardia When compared with ECG of 03-Nov-2019 21:18, No significant change was found     Recent Labs: No results found for requested labs within last 365 days.    Lipid Panel    Component Value Date/Time   CHOL 140 03/06/2015 0011   TRIG 60 03/06/2015 0011  HDL 52 03/06/2015 0011   CHOLHDL 2.7 03/06/2015 0011   VLDL 12 03/06/2015 0011   LDLCALC 76 03/06/2015 0011      Wt Readings from Last 3 Encounters:  10/09/23 174 lb 12.8 oz (79.3 kg)  07/04/22 170 lb (77.1 kg)  05/18/21 171 lb (77.6 kg)        ASSESSMENT AND PLAN:  1.  Coronary artery disease involving native coronary arteries without angina: He is doing extremely well.   He is on Plavix  monotherapy given his coronary artery disease and previous TIA.  2. Hyperlipidemia: Continue high-dose atorvastatin  80 mg daily.  I reviewed most recent lipid profile done in September which showed an LDL of 58.  3. Ischemic cardiomyopathy: Ejection fraction was 45-50%. He has no symptoms suggestive of volume overload or heart failure.  Continue small dose carvedilol  and lisinopril .    4.  TIA: No recurrent symptoms.  Continue clopidogrel  long-term.  5.  Type 2 diabetes: Most recently globin A1c was 7.0.  I discussed with him the importance of at least 150 minutes of exercise weekly.   Disposition:   FU with me in 12 months  Signed,  Antionette Kirks, MD  10/09/2023 4:18 PM    [facility]

## 2023-11-12 ENCOUNTER — Other Ambulatory Visit: Payer: Self-pay

## 2023-11-22 ENCOUNTER — Emergency Department
Admission: EM | Admit: 2023-11-22 | Discharge: 2023-11-23 | Disposition: A | Discharge status: Home / Self Care | Attending: Emergency Medicine | Admitting: Emergency Medicine

## 2023-11-22 ENCOUNTER — Other Ambulatory Visit: Payer: Self-pay

## 2023-11-22 ENCOUNTER — Encounter: Payer: Self-pay | Admitting: Emergency Medicine

## 2023-11-22 DIAGNOSIS — T63481A Toxic effect of venom of other arthropod, accidental (unintentional), initial encounter: Secondary | ICD-10-CM | POA: Diagnosis present

## 2023-11-22 DIAGNOSIS — Z91038 Other insect allergy status: Secondary | ICD-10-CM

## 2023-11-22 MED ORDER — METHYLPREDNISOLONE SODIUM SUCC 125 MG IJ SOLR
125.0000 mg | Freq: Once | INTRAMUSCULAR | Status: AC
  Administered 2023-11-22: 125 mg via INTRAVENOUS
  Filled 2023-11-22: qty 2

## 2023-11-22 NOTE — ED Triage Notes (Signed)
 Patient arrives via EMS from home for allergic reaction. Patient was bit by ants this evening and became to have hives and itching. Denies shortness of breath or GI upset. 50mg  benadryl  and epi pen (expired 2023) taken at 2240.

## 2023-11-23 MED ORDER — EPINEPHRINE 0.3 MG/0.3ML IJ SOAJ: 0.3000 mg | Freq: Once | INTRAMUSCULAR | 0 refills | Status: AC

## 2023-11-23 MED ORDER — FAMOTIDINE IN NACL 20-0.9 MG/50ML-% IV SOLN
20.0000 mg | Freq: Once | INTRAVENOUS | Status: AC
  Administered 2023-11-23: 20 mg via INTRAVENOUS
  Filled 2023-11-23: qty 50

## 2023-11-23 MED ORDER — PREDNISONE 20 MG PO TABS: 60.0000 mg | ORAL_TABLET | Freq: Every day | ORAL | 0 refills | Status: AC

## 2023-11-23 NOTE — ED Provider Notes (Signed)
 [facility] Provider Note    Event Date/Time   First MD Initiated Contact with Patient 11/22/23 2322     (approximate)   History   Allergic Reaction  The patient and/or family speak(s) Spanish.  They understand they have the right to the use of a hospital interpreter, however at this time they prefer to speak directly with me in Spanish.  They know that they can ask for an interpreter at any time.   HPI Jeff Howard is a 63 y.o. male who presents by EMS after multiple insect bites that he believes are ants.  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  As soon as he was bitten by the insects, he broke out in an itchy rash all over.  He used his EpiPen  and took 50 mg of Benadryl .  He feels little bit better but he still has a red raised rash mostly over his torso and on his arms.  He denies having any shortness of breath, nausea, vomiting, abdominal pain, and diarrhea.     Physical Exam   Triage Vital Signs: ED Triage Vitals  Encounter Vitals Group     BP 11/22/23 2323 (!) 153/79     Girls Systolic BP Percentile --      Girls Diastolic BP Percentile --      Boys Systolic BP Percentile --      Boys Diastolic BP Percentile --      Pulse Rate 11/22/23 2323 (!) 59     Resp 11/22/23 2323 17     Temp 11/22/23 2323 98.4 F (36.9 C)     Temp Source 11/22/23 2323 Oral     SpO2 11/22/23 2323 98 %     Weight 11/22/23 2322 77.1 kg (170 lb)     Height 11/22/23 2322 1.6 m (5' 3)     Head Circumference --      Peak Flow --      Pain Score --      Pain Loc --      Pain Education --      Exclude from Growth Chart --     Most recent vital signs: Vitals:   11/23/23 0300 11/23/23 0330  BP: 119/63 109/63  Pulse: (!) 51 (!) 57  Resp: 12 13  Temp:    SpO2: 96% 96%    General: Awake, no obvious distress. CV:  Good peripheral perfusion.  Regular rate and rhythm.  Normal heart sounds. Resp:  Normal effort. Speaking easily and comfortably,  no accessory muscle usage nor intercostal retractions.  Lungs clear to auscultation bilaterally.  No stridor. Abd:  No distention.  Tenderness to palpation of the abdomen. Other:  Patient is covered in a diffuse erythematous raised rash consistent with allergic reaction, urticarial but with coalescing lesions making it more widespread across his torso.  No facial involvement.  No mucosal involvement.   ED Results / Procedures / Treatments   Labs (all labs ordered are listed, but only abnormal results are displayed) Labs Reviewed - No data to display   PROCEDURES:  Critical Care performed: No  Procedures    IMPRESSION / MDM / ASSESSMENT AND PLAN / ED COURSE  I reviewed the triage vital signs and the nursing notes.                              Differential diagnosis includes, but is not limited to, allergic  reaction, anaphylaxis, medication or drug side effect.  Patient's presentation is most consistent with acute presentation with potential threat to life or bodily function.   Interventions/Medications given:  Medications  methylPREDNISolone  sodium succinate (SOLU-MEDROL ) 125 mg/2 mL injection 125 mg (125 mg Intravenous Given 11/22/23 2336)  famotidine  (PEPCID ) IVPB 20 mg premix (0 mg Intravenous Stopped 11/23/23 0107)    (Note:  hospital course my include additional interventions and/or labs/studies not listed above.)   Patient already used an EpiPen  at home and took Benadryl .  I ordered Solu-Medrol  125 mg IV and famotidine  20 mg IV and we will observe him.  He is not exhibiting signs of anaphylaxis but is clearly having an allergic reaction.  He is not a danger right now but may need additional epinephrine  if he worsens.  I plan to observe him for at least 4 hours.  Patient and family understand and agree with the plan.     Clinical Course as of 11/23/23 0354  Sun Nov 23, 2023  0351 Reassessed patient.  His rash has resolved.  No additional symptoms.  He and his wife are  comfortable with the plan for discharge.  I gave him new prescriptions as listed below.  He knows that the steroids will increase his glucose level.  I gave my usual and customary follow-up recommendations and return precautions. [CF]    Clinical Course User Index [CF] Lynnda Sas, MD     FINAL CLINICAL IMPRESSION(S) / ED DIAGNOSES   Final diagnoses:  Allergic reaction to insect bite     Rx / DC Orders   ED Discharge Orders          Ordered    predniSONE  (DELTASONE ) 20 MG tablet  Daily with breakfast        11/23/23 0354    EPINEPHrine  (EPIPEN  2-PAK) 0.3 mg/0.3 mL IJ SOAJ injection   Once        11/23/23 0354             Note:  This document was prepared using Dragon voice recognition software and may include unintentional dictation errors.   Lynnda Sas, MD 11/23/23 [phone]

## 2023-11-23 NOTE — Discharge Instructions (Signed)
 You have been seen in the Emergency Department (ED) today for an allergic reaction.  You have been stable throughout your stay in the Emergency Department.  Please take your medications as prescribed and follow up with your doctor as indicated.  You should also take a daily cetirizine (Zyrtec) which should help control your allergic symptoms.  Please keep your Epi-Pen with you at all times and use it if experience shortness of breath or difficulty breathing or if you believe you are having a severe allergic reaction.  If you use the Epi-Pen, though, please call 911 afterwards or go immediately to your nearest Emergency Department.  Return to the Emergency Department (ED) if you experience any worsening or new symptoms that concern you.

## 2023-12-31 ENCOUNTER — Other Ambulatory Visit: Payer: Self-pay

## 2024-01-12 ENCOUNTER — Other Ambulatory Visit: Payer: Self-pay

## 2024-01-12 MED ORDER — ATORVASTATIN CALCIUM 80 MG PO TABS
80.0000 mg | ORAL_TABLET | Freq: Every day | ORAL | 3 refills | Status: AC
  Filled 2024-01-12 – 2024-03-24 (×2): qty 90, 90d supply, fill #0
  Filled 2024-06-12 (×2): qty 90, 90d supply, fill #1

## 2024-01-12 MED ORDER — CLOPIDOGREL BISULFATE 75 MG PO TABS
75.0000 mg | ORAL_TABLET | Freq: Every day | ORAL | 3 refills | Status: AC
  Filled 2024-01-12: qty 90, 90d supply, fill #0
  Filled 2024-04-06: qty 90, 90d supply, fill #1
  Filled 2024-06-28: qty 90, 90d supply, fill #2

## 2024-01-12 MED ORDER — PIOGLITAZONE HCL 45 MG PO TABS
45.0000 mg | ORAL_TABLET | Freq: Every day | ORAL | 3 refills | Status: AC
  Filled 2024-01-12: qty 90, 90d supply, fill #0
  Filled 2024-06-12 (×2): qty 90, 90d supply, fill #1

## 2024-01-12 MED ORDER — CARVEDILOL 3.125 MG PO TABS
3.1250 mg | ORAL_TABLET | Freq: Two times a day (BID) | ORAL | 3 refills | Status: AC
  Filled 2024-01-12 – 2024-02-13 (×2): qty 180, 90d supply, fill #0
  Filled 2024-06-12 (×2): qty 180, 90d supply, fill #1

## 2024-01-13 ENCOUNTER — Other Ambulatory Visit: Payer: Self-pay

## 2024-01-26 ENCOUNTER — Encounter: Payer: Self-pay | Admitting: Emergency Medicine

## 2024-01-26 ENCOUNTER — Ambulatory Visit
Admission: EM | Admit: 2024-01-26 | Discharge: 2024-01-26 | Disposition: A | Discharge status: Home / Self Care | Attending: Family Medicine | Admitting: Family Medicine

## 2024-01-26 DIAGNOSIS — I10 Essential (primary) hypertension: Secondary | ICD-10-CM | POA: Diagnosis not present

## 2024-01-26 DIAGNOSIS — B029 Zoster without complications: Secondary | ICD-10-CM

## 2024-01-26 DIAGNOSIS — R109 Unspecified abdominal pain: Secondary | ICD-10-CM

## 2024-01-26 MED ORDER — GABAPENTIN 100 MG PO CAPS
100.0000 mg | ORAL_CAPSULE | Freq: Three times a day (TID) | ORAL | 0 refills | Status: AC
Start: 2024-01-26 — End: ?

## 2024-01-26 MED ORDER — VALACYCLOVIR HCL 1 G PO TABS: 1000.0000 mg | ORAL_TABLET | Freq: Three times a day (TID) | ORAL | 0 refills | Status: AC

## 2024-01-26 NOTE — ED Provider Notes (Addendum)
 [facility]    CSN: [id number] Arrival date & time: 01/26/24  9187      History   Chief Complaint Chief Complaint  Patient presents with   Abdominal Pain    HPI Jeff Howard is a 63 y.o. male.   HPI  History provided by patient and his wife    Jeff Howard presents for RUQ abdominal pain that started last Thursday that is getting worse.  Reports constant sharp burning pain that can wake him up from sleep. Pain doesn't radiate.  Took Tylenol  which helps some. Eating doesn't make the pain worse. No fever, nausea, vomiting or diarrhea.  No recent heavy lifting or coughing fits.      Past Surgeries: no previous abdominal surgeries   Symptoms Nausea/Vomiting: no  Diarrhea: no  Constipation: no  Melena/BRBPR: no  Hematemesis: no  Anorexia: no  Fever/Chills: no  Dysuria: no  Hematuria: no  Rash: no  Wt loss: no  EtOH use: yes, drank  NSAIDs/ASA: no  Sore throat: no   Cough: no Nasal congestion : no  Sleep disturbance: yes  Back Pain: no   Past Medical History:  Diagnosis Date   Anginal pain (HCC)    CAD (coronary artery disease)    a. 02/2015 NSTEMI/PCI: LM nl, LAD 39m (2.75x23 Xience/2.5x8 Xience DES), D2 20ost, LCX 74m, RCA 5m, RPDA1/2 40, EF 45-50.   Diabetes mellitus without complication (HCC)    Dizziness    Essential hypertension    Hyperlipemia    Ischemic cardiomyopathy    a. 02/2015 Echo: Ef 45-50%, mild apicalanterior HK, sev apical HK, triv MR.   Myocardial infarction [facility])     Patient Active Problem List   Diagnosis Date Noted   Acute loss of vision, right 11/04/2019   Sudden loss of vision, right 11/04/2019   Bradycardia 11/04/2019   CAD (coronary artery disease)    Hyperlipemia    Hypertension    Type 2 diabetes mellitus without complication (HCC)    Ischemic cardiomyopathy    Pain in the chest    Angina pectoris (HCC)    Poorly controlled type 2 diabetes mellitus with circulatory disorder (HCC)    Hyperlipidemia    NSTEMI  (non-ST elevated myocardial infarction) (HCC) 03/05/2015    Past Surgical History:  Procedure Laterality Date   arm surgery     left   CARDIAC CATHETERIZATION N/A 03/06/2015   Procedure: Left Heart Cath and Coronary Angiography;  Surgeon: Deatrice DELENA Cage, MD;  Location: [facility] INVASIVE CV LAB;  Service: Cardiovascular;  Laterality: N/A;   CARDIAC CATHETERIZATION N/A 03/06/2015   Procedure: Coronary Stent Intervention;  Surgeon: Deatrice DELENA Cage, MD;  Location: [facility] INVASIVE CV LAB;  Service: Cardiovascular;  Laterality: N/A;   COLONOSCOPY     COLONOSCOPY N/A 03/27/2020   Procedure: COLONOSCOPY;  Surgeon: Maryruth Ole DASEN, MD;  Location: [facility] ENDOSCOPY;  Service: Endoscopy;  Laterality: N/A;   CORONARY ANGIOPLASTY     CORONARY ANGIOPLASTY WITH STENT PLACEMENT     x2       Home Medications    Prior to Admission medications   Medication Sig Start Date End Date Taking? Authorizing Provider  EPINEPHrine  0.3 mg/0.3 mL IJ SOAJ injection Inject into the muscle. 11/23/23  Yes [provider]  gabapentin  (NEURONTIN ) 100 MG capsule Take 1 capsule (100 mg total) by mouth 3 (three) times daily. 01/26/24  Yes Surena Welge, DO  valACYclovir  (VALTREX ) 1000 MG tablet Take 1 tablet (1,000 mg total) by mouth 3 (three) times daily  for 7 days. 01/26/24 02/02/24 Yes Bailea Beed, DO  atorvastatin  (LIPITOR ) 80 MG tablet Take 1 tablet (80 mg total) by mouth daily. 02/14/23     atorvastatin  (LIPITOR ) 80 MG tablet Take 1 tablet (80 mg total) by mouth daily. 01/12/24     carvedilol  (COREG ) 3.125 MG tablet Take 1 tablet (3.125 mg total) by mouth 2 (two) times daily with a meal. 01/12/24     clopidogrel  (PLAVIX ) 75 MG tablet Take 1 tablet (75 mg total) by mouth daily. 10/08/23   Darron Deatrice LABOR, MD  clopidogrel  (PLAVIX ) 75 MG tablet Take 1 tablet (75 mg total) by mouth daily. 01/12/24     empagliflozin  (JARDIANCE ) 10 MG TABS tablet Take 1 tablet (10 mg total) by mouth. Patient taking differently: Take 25  mg by mouth daily. 06/15/21     empagliflozin  (JARDIANCE ) 25 MG TABS tablet Take 1 tablet (25 mg total) by mouth once daily 01/01/23     empagliflozin  (JARDIANCE ) 25 MG TABS tablet Take 1 tablet (25 mg total) by mouth once daily 09/11/23     lisinopril  (ZESTRIL ) 10 MG tablet Take 1 tablet (10 mg total) by mouth once daily 09/11/23     metFORMIN  (GLUCOPHAGE -XR) 500 MG 24 hr tablet Take 2 tablets (1,000 mg total) by mouth daily with dinner 01/01/22     metFORMIN  (GLUCOPHAGE -XR) 500 MG 24 hr tablet Take 2 tablets (1,000 mg total) by mouth daily with supper. 02/14/23     metFORMIN  (GLUCOPHAGE -XR) 500 MG 24 hr tablet Take 2 tablets (1,000 mg total) by mouth 2 (two) times daily with meals 09/11/23     nitroGLYCERIN  (NITROSTAT ) 0.4 MG SL tablet Place one tablet under the tongue every 5 minutes as needed for chest pain 10/06/20     pioglitazone  (ACTOS ) 45 MG tablet Take 1 tablet (45 mg total) by mouth daily. 01/17/23     pioglitazone  (ACTOS ) 45 MG tablet Take 1 tablet (45 mg total) by mouth daily. 01/12/24     sildenafil  (VIAGRA ) 100 MG tablet Take 100 mg by mouth daily as needed.    [provider]  sildenafil  (VIAGRA ) 100 MG tablet Take 1 tablet (100 mg total) by mouth daily as needed for erectile dysfunction. 07/10/22     sildenafil  (VIAGRA ) 100 MG tablet Take 1 tablet (100 mg total) by mouth daily as needed for erectile dysfunction 02/14/23     sildenafil  (VIAGRA ) 100 MG tablet Take 1 tablet (100 mg total) by mouth once daily as needed for Erectile Dysfunction for up to 90 days 09/11/23       Family History Family History  Problem Relation Age of Onset   Heart attack Father    Diabetes Other     Social History Social History   Tobacco Use   Smoking status: Never   Smokeless tobacco: Never  Vaping Use   Vaping status: Never Used  Substance Use Topics   Alcohol use: Yes    Comment: social   Drug use: No     Allergies   Bee venom, Effient  [prasugrel ], and Fire ant   Review of Systems Review of  Systems :negative unless otherwise stated in HPI.      Physical Exam Triage Vital Signs ED Triage Vitals  Encounter Vitals Group     BP --      Girls Systolic BP Percentile --      Girls Diastolic BP Percentile --      Boys Systolic BP Percentile --      Boys Diastolic BP  Percentile --      Pulse --      Resp --      Temp --      Temp src --      SpO2 --      Weight 01/26/24 0824 180 lb (81.6 kg)     Height --      Head Circumference --      Peak Flow --      Pain Score 01/26/24 0825 8     Pain Loc --      Pain Education --      Exclude from Growth Chart --    No data found.  Updated Vital Signs BP (!) 198/77 (BP Location: Left Arm)   Pulse (!) 49   Temp 98.2 F (36.8 C) (Oral)   Resp 16   Wt 81.6 kg   SpO2 95%   BMI 31.89 kg/m   Visual Acuity Right Eye Distance:   Left Eye Distance:   Bilateral Distance:    Right Eye Near:   Left Eye Near:    Bilateral Near:     Physical Exam  GEN: pleasant well appearing male, in no acute distress   CV: regular rhythm, bradycardic RESP: no increased work of breathing, clear to ascultation bilaterally ABD: Bowel sounds present. Soft, non-tender, non-distended.   No guarding, no rebound, no appreciable hepatosplenomegaly, no CVA tenderness, negative McBurney's, negative Murphy MSK: no extremity edema SKIN: warm, dry, right flank with erythematous blistering rash (see image below) NEURO: alert, moves all extremities appropriately    UC Treatments / Results  Labs (all labs ordered are listed, but only abnormal results are displayed) Labs Reviewed - No data to display  EKG  If EKG performed, see my interpretation and MDM section  Radiology No results found.   Procedures Procedures (including critical care time)  Medications Ordered in UC Medications - No data to display  Initial Impression / Assessment and Plan / UC Course  I have reviewed the triage vital signs and the nursing notes.  Pertinent labs &  imaging results that were available during my care of the patient were reviewed by me and considered in my medical decision making (see chart for details).       Patient is a  63 y.o. male who presents for worsening right flank pain for the past 3 days.  Overall, patient is well-appearing, well-hydrated, and in no acute distress.  Bradycardia is not new. Jeff Howard is afebrile.    Differential diagnosis includes UTI, STI, biliary colic, cholecystitis, pancreatitis, gastroenteritis, kidney stone, constipation, abdominal mass, hepatitis, cellulitis, viral exanthem  Exam is not concerning for an acute abdomen. He has no abdominal tenderness. Abdominal imaging and labs deferred.  On inspection, he has a erythematous patch with blisters concerning for Shingles. He has not had the Shingles vaccines.  Treat with Valtrex  and Gabapentin  as below. Serum creatine in April was normal.   Jeff Howard is hypertensive here.  BP 189/77 then after sitting was 198/77. Previous blood pressures from chart review were normal.  Seen by PCP on 01/12/24 and BP was 138/66. I suspect high blood pressure issue is secondary to pain and not taking his medication today. He is prescribed Lisinopril  an Coreg .  Denies needing refills. He is to take his medications when he gets home. Wife states she will make sure of it. Discussed that these are significantly elevated blood pressures. Recommended he check hisblood pressure and follow up with his primary care provider soon.  Follow-up, return and ED precautions given. Discussed MDM, treatment plan and plan for follow-up with patient who agrees with plan.    Final Clinical Impressions(s) / UC Diagnoses   Final diagnoses:  Right flank pain  Herpes zoster without complication  Elevated blood pressure reading with diagnosis of hypertension     Discharge Instructions      Consulte el folleto sobre la culebrilla. Pase por la farmacia a recoger sus medicamentos. Consulte con su mdico  de cabecera o regrese a urgencias si no mejora. Controle su presin arterial en casa. Tome su medicamento para la presin arterial a diario. Si la presin arterial permanece elevada, consulte con su mdico de cabecera pronto.  See handout on Shingles.  Stop by the pharmacy to pick up your prescriptions.  Follow up with your primary care provider or return to the urgent care, if not improving.  Monitor your blood pressures at home. Take your blood pressure medication daily.  It remains elevated, follow up with your primary care provider soon.       ED Prescriptions     Medication Sig Dispense Auth. Provider   valACYclovir  (VALTREX ) 1000 MG tablet Take 1 tablet (1,000 mg total) by mouth 3 (three) times daily for 7 days. 21 tablet Bellamy Judson, DO   gabapentin  (NEURONTIN ) 100 MG capsule Take 1 capsule (100 mg total) by mouth 3 (three) times daily. 30 capsule Paytan Recine, DO      PDMP not reviewed this encounter.       Voyd Groft, DO 01/26/24 1017

## 2024-01-26 NOTE — Discharge Instructions (Addendum)
 Consulte el folleto sobre la Flat. Pase por la farmacia a recoger sus medicamentos. Consulte con su mdico de cabecera o regrese a urgencias si no mejora. Controle su presin arterial en casa. Tome su medicamento para la presin arterial a diario. Si la presin arterial permanece elevada, consulte con su mdico de cabecera pronto.  See handout on Shingles.  Stop by the pharmacy to pick up your prescriptions.  Follow up with your primary care provider or return to the urgent care, if not improving.  Monitor your blood pressures at home. Take your blood pressure medication daily.  It remains elevated, follow up with your primary care provider soon.

## 2024-01-26 NOTE — ED Triage Notes (Signed)
 Pt presents with right side abdominal pain near his rib cage x 4 days. Pt describes the pain as sharp and constant. He denies any injury, nausea, vomiting,or diarrhea. When his symptoms first started he was taking tylenol  and that helped, but now its not helping.

## 2024-02-02 ENCOUNTER — Other Ambulatory Visit: Payer: Self-pay

## 2024-02-13 ENCOUNTER — Other Ambulatory Visit: Payer: Self-pay

## 2024-03-24 ENCOUNTER — Other Ambulatory Visit: Payer: Self-pay

## 2024-04-06 ENCOUNTER — Other Ambulatory Visit: Payer: Self-pay

## 2024-06-12 ENCOUNTER — Other Ambulatory Visit: Payer: Self-pay

## 2024-06-16 ENCOUNTER — Other Ambulatory Visit: Payer: Self-pay

## 2024-06-28 ENCOUNTER — Other Ambulatory Visit: Payer: Self-pay

## 2024-06-29 ENCOUNTER — Other Ambulatory Visit: Payer: Self-pay

## 2024-06-30 ENCOUNTER — Other Ambulatory Visit: Payer: Self-pay

## 2024-06-30 MED ORDER — NITROGLYCERIN 0.4 MG SL SUBL
SUBLINGUAL_TABLET | SUBLINGUAL | 2 refills | Status: AC
  Filled 2024-06-30: qty 25, 8d supply, fill #0

## 2024-07-02 ENCOUNTER — Other Ambulatory Visit: Payer: Self-pay

## 2024-07-08 ENCOUNTER — Other Ambulatory Visit: Payer: Self-pay
